# Patient Record
Sex: Female | Born: 1950 | Race: White | Hispanic: No | Smoking: Current every day smoker
Health system: Southern US, Community
[De-identification: ages and names within clinical notes are randomized; demographics above are authoritative.]

## PROBLEM LIST (undated history)

## (undated) DIAGNOSIS — E785 Hyperlipidemia, unspecified: Secondary | ICD-10-CM

## (undated) DIAGNOSIS — E559 Vitamin D deficiency, unspecified: Secondary | ICD-10-CM

## (undated) DIAGNOSIS — F32A Depression, unspecified: Secondary | ICD-10-CM

## (undated) DIAGNOSIS — F419 Anxiety disorder, unspecified: Secondary | ICD-10-CM

## (undated) DIAGNOSIS — J449 Chronic obstructive pulmonary disease, unspecified: Secondary | ICD-10-CM

## (undated) DIAGNOSIS — I10 Essential (primary) hypertension: Secondary | ICD-10-CM

## (undated) DIAGNOSIS — F329 Major depressive disorder, single episode, unspecified: Secondary | ICD-10-CM

## (undated) DIAGNOSIS — Z8709 Personal history of other diseases of the respiratory system: Secondary | ICD-10-CM

## (undated) DIAGNOSIS — J45909 Unspecified asthma, uncomplicated: Secondary | ICD-10-CM

## (undated) DIAGNOSIS — K759 Inflammatory liver disease, unspecified: Secondary | ICD-10-CM

## (undated) HISTORY — PX: OTHER SURGICAL HISTORY: SHX169

---

## 2013-04-12 ENCOUNTER — Encounter (HOSPITAL_COMMUNITY): Payer: Self-pay | Admitting: Anesthesiology

## 2013-04-12 ENCOUNTER — Inpatient Hospital Stay (HOSPITAL_COMMUNITY): Payer: BC Managed Care – PPO

## 2013-04-12 ENCOUNTER — Ambulatory Visit (HOSPITAL_COMMUNITY): Payer: BC Managed Care – PPO | Admitting: Anesthesiology

## 2013-04-12 ENCOUNTER — Ambulatory Visit (HOSPITAL_COMMUNITY): Payer: BC Managed Care – PPO

## 2013-04-12 ENCOUNTER — Encounter (HOSPITAL_COMMUNITY): Payer: BC Managed Care – PPO | Admitting: Anesthesiology

## 2013-04-12 ENCOUNTER — Inpatient Hospital Stay (HOSPITAL_COMMUNITY)
Admission: AD | Admit: 2013-04-12 | Discharge: 2013-04-16 | DRG: 493 | Disposition: A | Discharge status: Home Health | Payer: BC Managed Care – PPO | Source: Ambulatory Visit | Attending: Orthopedic Surgery | Admitting: Orthopedic Surgery

## 2013-04-12 ENCOUNTER — Encounter (HOSPITAL_COMMUNITY)
Admission: AD | Disposition: A | Discharge status: Home Health | Payer: Self-pay | Source: Ambulatory Visit | Attending: Orthopedic Surgery

## 2013-04-12 DIAGNOSIS — F3289 Other specified depressive episodes: Secondary | ICD-10-CM | POA: Diagnosis present

## 2013-04-12 DIAGNOSIS — F419 Anxiety disorder, unspecified: Secondary | ICD-10-CM | POA: Diagnosis present

## 2013-04-12 DIAGNOSIS — W19XXXA Unspecified fall, initial encounter: Secondary | ICD-10-CM | POA: Diagnosis present

## 2013-04-12 DIAGNOSIS — F32A Depression, unspecified: Secondary | ICD-10-CM | POA: Diagnosis present

## 2013-04-12 DIAGNOSIS — J449 Chronic obstructive pulmonary disease, unspecified: Secondary | ICD-10-CM | POA: Diagnosis present

## 2013-04-12 DIAGNOSIS — G8918 Other acute postprocedural pain: Secondary | ICD-10-CM | POA: Diagnosis not present

## 2013-04-12 DIAGNOSIS — D62 Acute posthemorrhagic anemia: Secondary | ICD-10-CM | POA: Diagnosis not present

## 2013-04-12 DIAGNOSIS — I10 Essential (primary) hypertension: Secondary | ICD-10-CM | POA: Diagnosis present

## 2013-04-12 DIAGNOSIS — Z7982 Long term (current) use of aspirin: Secondary | ICD-10-CM

## 2013-04-12 DIAGNOSIS — B182 Chronic viral hepatitis C: Secondary | ICD-10-CM | POA: Diagnosis present

## 2013-04-12 DIAGNOSIS — F411 Generalized anxiety disorder: Secondary | ICD-10-CM | POA: Diagnosis present

## 2013-04-12 DIAGNOSIS — M949 Disorder of cartilage, unspecified: Secondary | ICD-10-CM

## 2013-04-12 DIAGNOSIS — S42301A Unspecified fracture of shaft of humerus, right arm, initial encounter for closed fracture: Secondary | ICD-10-CM

## 2013-04-12 DIAGNOSIS — Z79899 Other long term (current) drug therapy: Secondary | ICD-10-CM

## 2013-04-12 DIAGNOSIS — E871 Hypo-osmolality and hyponatremia: Secondary | ICD-10-CM | POA: Diagnosis not present

## 2013-04-12 DIAGNOSIS — M899 Disorder of bone, unspecified: Secondary | ICD-10-CM | POA: Diagnosis present

## 2013-04-12 DIAGNOSIS — S42309A Unspecified fracture of shaft of humerus, unspecified arm, initial encounter for closed fracture: Principal | ICD-10-CM

## 2013-04-12 DIAGNOSIS — R110 Nausea: Secondary | ICD-10-CM | POA: Diagnosis not present

## 2013-04-12 DIAGNOSIS — E785 Hyperlipidemia, unspecified: Secondary | ICD-10-CM | POA: Diagnosis present

## 2013-04-12 DIAGNOSIS — J4489 Other specified chronic obstructive pulmonary disease: Secondary | ICD-10-CM | POA: Diagnosis present

## 2013-04-12 DIAGNOSIS — F17200 Nicotine dependence, unspecified, uncomplicated: Secondary | ICD-10-CM | POA: Diagnosis present

## 2013-04-12 DIAGNOSIS — E559 Vitamin D deficiency, unspecified: Secondary | ICD-10-CM | POA: Diagnosis present

## 2013-04-12 DIAGNOSIS — K759 Inflammatory liver disease, unspecified: Secondary | ICD-10-CM | POA: Diagnosis present

## 2013-04-12 DIAGNOSIS — Z66 Do not resuscitate: Secondary | ICD-10-CM | POA: Diagnosis present

## 2013-04-12 DIAGNOSIS — F329 Major depressive disorder, single episode, unspecified: Secondary | ICD-10-CM | POA: Diagnosis present

## 2013-04-12 HISTORY — DX: Essential (primary) hypertension: I10

## 2013-04-12 HISTORY — DX: Unspecified asthma, uncomplicated: J45.909

## 2013-04-12 HISTORY — DX: Hyperlipidemia, unspecified: E78.5

## 2013-04-12 HISTORY — DX: Personal history of other diseases of the respiratory system: Z87.09

## 2013-04-12 HISTORY — DX: Inflammatory liver disease, unspecified: K75.9

## 2013-04-12 HISTORY — DX: Anxiety disorder, unspecified: F41.9

## 2013-04-12 HISTORY — DX: Vitamin D deficiency, unspecified: E55.9

## 2013-04-12 HISTORY — DX: Chronic obstructive pulmonary disease, unspecified: J44.9

## 2013-04-12 HISTORY — DX: Major depressive disorder, single episode, unspecified: F32.9

## 2013-04-12 HISTORY — PX: ORIF HUMERUS FRACTURE: SHX2126

## 2013-04-12 HISTORY — DX: Depression, unspecified: F32.A

## 2013-04-12 LAB — COMPREHENSIVE METABOLIC PANEL
ALT: 7 U/L (ref 0–35)
AST: 18 U/L (ref 0–37)
Albumin: 2.5 g/dL — ABNORMAL LOW (ref 3.5–5.2)
Alkaline Phosphatase: 122 U/L — ABNORMAL HIGH (ref 39–117)
BILIRUBIN TOTAL: 0.3 mg/dL (ref 0.3–1.2)
BUN: 12 mg/dL (ref 6–23)
CALCIUM: 8.7 mg/dL (ref 8.4–10.5)
CO2: 24 meq/L (ref 19–32)
Chloride: 101 mEq/L (ref 96–112)
Creatinine, Ser: 1.12 mg/dL — ABNORMAL HIGH (ref 0.50–1.10)
GFR, EST AFRICAN AMERICAN: 60 mL/min — AB (ref >90)
GFR, EST NON AFRICAN AMERICAN: 52 mL/min — AB (ref >90)
Glucose, Bld: 103 mg/dL — ABNORMAL HIGH (ref 70–99)
Potassium: 5.6 mEq/L — ABNORMAL HIGH (ref 3.7–5.3)
Sodium: 138 mEq/L (ref 137–147)
Total Protein: 5.4 g/dL — ABNORMAL LOW (ref 6.0–8.3)

## 2013-04-12 LAB — CBC
HCT: 33.2 % — ABNORMAL LOW (ref 36.0–46.0)
HCT: 26.3 % — ABNORMAL LOW (ref 36.0–46.0)
HEMOGLOBIN: 8.9 g/dL — AB (ref 12.0–15.0)
Hemoglobin: 10.8 g/dL — ABNORMAL LOW (ref 12.0–15.0)
MCH: 32.2 pg (ref 26.0–34.0)
MCH: 32.1 pg (ref 26.0–34.0)
MCHC: 32.5 g/dL (ref 30.0–36.0)
MCHC: 33.8 g/dL (ref 30.0–36.0)
MCV: 99.1 fL (ref 78.0–100.0)
MCV: 94.9 fL (ref 78.0–100.0)
PLATELETS: 283 10*3/uL (ref 150–400)
Platelets: 283 10*3/uL (ref 150–400)
RBC: 3.35 MIL/uL — ABNORMAL LOW (ref 3.87–5.11)
RBC: 2.77 MIL/uL — ABNORMAL LOW (ref 3.87–5.11)
RDW: 13.3 % (ref 11.5–15.5)
RDW: 13.2 % (ref 11.5–15.5)
WBC: 7.9 10*3/uL (ref 4.0–10.5)
WBC: 10.7 10*3/uL — ABNORMAL HIGH (ref 4.0–10.5)

## 2013-04-12 LAB — APTT: aPTT: 30 seconds (ref 24–37)

## 2013-04-12 LAB — PROTIME-INR
INR: 0.92 (ref 0.00–1.49)
PROTHROMBIN TIME: 12.2 s (ref 11.6–15.2)

## 2013-04-12 SURGERY — OPEN REDUCTION INTERNAL FIXATION (ORIF) PROXIMAL HUMERUS FRACTURE
Anesthesia: General | Site: Arm Upper | Laterality: Right

## 2013-04-12 MED ORDER — POTASSIUM CHLORIDE IN NACL 20-0.9 MEQ/L-% IV SOLN
INTRAVENOUS | Status: DC
Start: 2013-04-12 — End: 2013-04-15
  Administered 2013-04-12 – 2013-04-15 (×3): via INTRAVENOUS
  Filled 2013-04-12 (×9): qty 1000

## 2013-04-12 MED ORDER — MAGNESIUM CITRATE PO SOLN: 1.0000 | Freq: Once | ORAL | Status: AC | PRN

## 2013-04-12 MED ORDER — FLUOXETINE HCL 20 MG PO CAPS
20.0000 mg | ORAL_CAPSULE | Freq: Every day | ORAL | Status: DC
  Administered 2013-04-13 – 2013-04-15 (×3): 20 mg via ORAL
  Filled 2013-04-12 (×5): qty 1

## 2013-04-12 MED ORDER — ONDANSETRON HCL 4 MG/2ML IJ SOLN
4.0000 mg | Freq: Once | INTRAMUSCULAR | Status: AC | PRN
  Administered 2013-04-12: 4 mg via INTRAVENOUS

## 2013-04-12 MED ORDER — LACTATED RINGERS IV SOLN
INTRAVENOUS | Status: DC | PRN
  Administered 2013-04-12 (×2): via INTRAVENOUS

## 2013-04-12 MED ORDER — METHOCARBAMOL 100 MG/ML IJ SOLN
500.0000 mg | Freq: Four times a day (QID) | INTRAVENOUS | Status: DC | PRN
  Filled 2013-04-12: qty 5

## 2013-04-12 MED ORDER — FENTANYL CITRATE 0.05 MG/ML IJ SOLN
INTRAMUSCULAR | Status: AC
  Administered 2013-04-12: 100 ug
  Filled 2013-04-12: qty 2

## 2013-04-12 MED ORDER — DIPHENHYDRAMINE HCL 12.5 MG/5ML PO ELIX
12.5000 mg | ORAL_SOLUTION | ORAL | Status: DC | PRN
  Administered 2013-04-15: 25 mg via ORAL
  Filled 2013-04-12 (×2): qty 10

## 2013-04-12 MED ORDER — PROPOFOL 10 MG/ML IV BOLUS
INTRAVENOUS | Status: DC | PRN
  Administered 2013-04-12: 200 mg via INTRAVENOUS

## 2013-04-12 MED ORDER — HYDROMORPHONE HCL PF 1 MG/ML IJ SOLN
0.5000 mg | INTRAMUSCULAR | Status: DC | PRN
  Administered 2013-04-13 – 2013-04-14 (×6): 1 mg via INTRAVENOUS
  Administered 2013-04-14: 0.5 mg via INTRAVENOUS
  Administered 2013-04-14 – 2013-04-16 (×4): 1 mg via INTRAVENOUS
  Filled 2013-04-12 (×12): qty 1

## 2013-04-12 MED ORDER — ONDANSETRON HCL 4 MG/2ML IJ SOLN
INTRAMUSCULAR | Status: AC
  Administered 2013-04-12: 18:00:00 4 mg via INTRAVENOUS
  Filled 2013-04-12: qty 2

## 2013-04-12 MED ORDER — ONDANSETRON HCL 4 MG/2ML IJ SOLN
4.0000 mg | Freq: Four times a day (QID) | INTRAMUSCULAR | Status: DC | PRN
  Administered 2013-04-13 (×2): 4 mg via INTRAVENOUS
  Filled 2013-04-12 (×2): qty 2

## 2013-04-12 MED ORDER — METOCLOPRAMIDE HCL 5 MG/ML IJ SOLN
5.0000 mg | Freq: Three times a day (TID) | INTRAMUSCULAR | Status: DC | PRN
  Filled 2013-04-12 (×2): qty 2

## 2013-04-12 MED ORDER — HYDROMORPHONE HCL PF 1 MG/ML IJ SOLN
0.2500 mg | INTRAMUSCULAR | Status: DC | PRN
  Administered 2013-04-12 (×4): 0.5 mg via INTRAVENOUS

## 2013-04-12 MED ORDER — GLYCOPYRROLATE 0.2 MG/ML IJ SOLN
INTRAMUSCULAR | Status: DC | PRN
  Administered 2013-04-12: 0.6 mg via INTRAVENOUS

## 2013-04-12 MED ORDER — OXYCODONE HCL 5 MG PO TABS
ORAL_TABLET | ORAL | Status: AC
Start: 2013-04-12 — End: 2013-04-13
  Filled 2013-04-12: qty 3

## 2013-04-12 MED ORDER — FENTANYL CITRATE 0.05 MG/ML IJ SOLN
INTRAMUSCULAR | Status: DC | PRN
  Administered 2013-04-12: 50 ug via INTRAVENOUS
  Administered 2013-04-12 (×2): 100 ug via INTRAVENOUS

## 2013-04-12 MED ORDER — DOCUSATE SODIUM 100 MG PO CAPS
100.0000 mg | ORAL_CAPSULE | Freq: Two times a day (BID) | ORAL | Status: DC
  Administered 2013-04-13 – 2013-04-16 (×7): 100 mg via ORAL
  Filled 2013-04-12 (×9): qty 1

## 2013-04-12 MED ORDER — METHOCARBAMOL 100 MG/ML IJ SOLN
500.0000 mg | INTRAVENOUS | Status: AC
  Administered 2013-04-12: 500 mg via INTRAVENOUS
  Filled 2013-04-12: qty 5

## 2013-04-12 MED ORDER — CEFAZOLIN SODIUM 1-5 GM-% IV SOLN
1.0000 g | Freq: Four times a day (QID) | INTRAVENOUS | Status: AC
  Administered 2013-04-12 – 2013-04-13 (×3): 1 g via INTRAVENOUS
  Filled 2013-04-12 (×3): qty 50

## 2013-04-12 MED ORDER — CEFAZOLIN SODIUM-DEXTROSE 2-3 GM-% IV SOLR
INTRAVENOUS | Status: AC
  Administered 2013-04-12: 2 g via INTRAVENOUS
  Filled 2013-04-12: qty 50

## 2013-04-12 MED ORDER — NEOSTIGMINE METHYLSULFATE 1 MG/ML IJ SOLN
INTRAMUSCULAR | Status: DC | PRN
  Administered 2013-04-12: 4 mg via INTRAVENOUS
  Administered 2013-04-12: 1 mg via INTRAVENOUS

## 2013-04-12 MED ORDER — ONDANSETRON HCL 4 MG/2ML IJ SOLN
INTRAMUSCULAR | Status: DC | PRN
  Administered 2013-04-12: 4 mg via INTRAVENOUS

## 2013-04-12 MED ORDER — OXYCODONE HCL 5 MG PO TABS
5.0000 mg | ORAL_TABLET | ORAL | Status: DC | PRN
  Administered 2013-04-12 – 2013-04-15 (×11): 15 mg via ORAL
  Administered 2013-04-15: 10 mg via ORAL
  Administered 2013-04-15 – 2013-04-16 (×4): 15 mg via ORAL
  Filled 2013-04-12 (×10): qty 3
  Filled 2013-04-12: qty 2
  Filled 2013-04-12 (×4): qty 3

## 2013-04-12 MED ORDER — HYDROMORPHONE HCL PF 1 MG/ML IJ SOLN
INTRAMUSCULAR | Status: AC
  Administered 2013-04-12: 0.5 mg via INTRAVENOUS
  Filled 2013-04-12: qty 1

## 2013-04-12 MED ORDER — METHOCARBAMOL 500 MG PO TABS
500.0000 mg | ORAL_TABLET | Freq: Four times a day (QID) | ORAL | Status: DC | PRN
  Administered 2013-04-14: 1000 mg via ORAL
  Filled 2013-04-12 (×2): qty 2

## 2013-04-12 MED ORDER — ROCURONIUM BROMIDE 100 MG/10ML IV SOLN
INTRAVENOUS | Status: DC | PRN
  Administered 2013-04-12: 40 mg via INTRAVENOUS
  Administered 2013-04-12 (×3): 10 mg via INTRAVENOUS

## 2013-04-12 MED ORDER — ALBUTEROL SULFATE HFA 108 (90 BASE) MCG/ACT IN AERS
INHALATION_SPRAY | RESPIRATORY_TRACT | Status: DC | PRN
  Administered 2013-04-12 (×2): 2 via RESPIRATORY_TRACT

## 2013-04-12 MED ORDER — 0.9 % SODIUM CHLORIDE (POUR BTL) OPTIME
TOPICAL | Status: DC | PRN
  Administered 2013-04-12: 1000 mL

## 2013-04-12 MED ORDER — ALBUMIN HUMAN 5 % IV SOLN
INTRAVENOUS | Status: DC | PRN
  Administered 2013-04-12: 16:00:00 via INTRAVENOUS

## 2013-04-12 MED ORDER — METOCLOPRAMIDE HCL 5 MG PO TABS
5.0000 mg | ORAL_TABLET | Freq: Three times a day (TID) | ORAL | Status: DC | PRN
  Filled 2013-04-12: qty 2

## 2013-04-12 MED ORDER — PHENYLEPHRINE HCL 10 MG/ML IJ SOLN
10.0000 mg | INTRAVENOUS | Status: DC | PRN
  Administered 2013-04-12: 25 ug/min via INTRAVENOUS

## 2013-04-12 MED ORDER — BISACODYL 10 MG RE SUPP
10.0000 mg | Freq: Every day | RECTAL | Status: DC | PRN
Start: 2013-04-12 — End: 2013-04-16

## 2013-04-12 MED ORDER — FERROUS SULFATE 325 (65 FE) MG PO TABS
325.0000 mg | ORAL_TABLET | Freq: Three times a day (TID) | ORAL | Status: DC
  Administered 2013-04-13 – 2013-04-16 (×9): 325 mg via ORAL
  Filled 2013-04-12 (×13): qty 1

## 2013-04-12 MED ORDER — LACTATED RINGERS IV SOLN
INTRAVENOUS | Status: DC
  Administered 2013-04-12: 13:00:00 via INTRAVENOUS

## 2013-04-12 MED ORDER — LIDOCAINE HCL (CARDIAC) 20 MG/ML IV SOLN
INTRAVENOUS | Status: DC | PRN
  Administered 2013-04-12: 100 mg via INTRAVENOUS

## 2013-04-12 MED ORDER — ONDANSETRON HCL 4 MG PO TABS: 4.0000 mg | ORAL_TABLET | Freq: Four times a day (QID) | ORAL | Status: DC | PRN

## 2013-04-12 SURGICAL SUPPLY — 80 items
BANDAGE ELASTIC 3 VELCRO ST LF (GAUZE/BANDAGES/DRESSINGS) ×3 IMPLANT
BANDAGE ELASTIC 4 VELCRO ST LF (GAUZE/BANDAGES/DRESSINGS) ×3 IMPLANT
BANDAGE GAUZE ELAST BULKY 4 IN (GAUZE/BANDAGES/DRESSINGS) ×6 IMPLANT
BENZOIN TINCTURE PRP APPL 2/3 (GAUZE/BANDAGES/DRESSINGS) ×6 IMPLANT
BIT DRILL 2.5 X LONG (BIT) ×1
BIT DRILL PERC QC 2.8X200 100 (BIT) ×2 IMPLANT
BIT DRILL QC 3.5X110 (BIT) ×3 IMPLANT
BIT DRILL X LONG 2.5 (BIT) ×1 IMPLANT
BRUSH SCRUB DISP (MISCELLANEOUS) ×6 IMPLANT
CLOTH BEACON ORANGE TIMEOUT ST (SAFETY) ×3 IMPLANT
COVER SURGICAL LIGHT HANDLE (MISCELLANEOUS) ×6 IMPLANT
DRAPE C-ARM 42X72 X-RAY (DRAPES) ×3 IMPLANT
DRAPE C-ARMOR (DRAPES) ×3 IMPLANT
DRAPE INCISE IOBAN 66X45 STRL (DRAPES) IMPLANT
DRAPE ORTHO SPLIT 77X108 STRL (DRAPES) ×4
DRAPE SURG 17X11 SM STRL (DRAPES) ×6 IMPLANT
DRAPE SURG ORHT 6 SPLT 77X108 (DRAPES) ×2 IMPLANT
DRAPE U-SHAPE 47X51 STRL (DRAPES) ×6 IMPLANT
DRILL BIT QUICK COUP 2.8MM 100 (BIT) ×4
DRILL BIT X LONG 2.5 (BIT) ×2
DRSG ADAPTIC 3X8 NADH LF (GAUZE/BANDAGES/DRESSINGS) ×3 IMPLANT
DRSG MEPILEX BORDER 4X12 (GAUZE/BANDAGES/DRESSINGS) ×3 IMPLANT
DRSG PAD ABDOMINAL 8X10 ST (GAUZE/BANDAGES/DRESSINGS) ×3 IMPLANT
ELECT REM PT RETURN 9FT ADLT (ELECTROSURGICAL) ×3
ELECTRODE REM PT RTRN 9FT ADLT (ELECTROSURGICAL) ×1 IMPLANT
EVACUATOR 1/8 PVC DRAIN (DRAIN) IMPLANT
GLOVE BIO SURGEON STRL SZ7.5 (GLOVE) ×3 IMPLANT
GLOVE BIO SURGEON STRL SZ8 (GLOVE) ×3 IMPLANT
GLOVE BIOGEL PI IND STRL 7.5 (GLOVE) ×1 IMPLANT
GLOVE BIOGEL PI IND STRL 8 (GLOVE) ×1 IMPLANT
GLOVE BIOGEL PI INDICATOR 7.5 (GLOVE) ×2
GLOVE BIOGEL PI INDICATOR 8 (GLOVE) ×2
GOWN PREVENTION PLUS XLARGE (GOWN DISPOSABLE) ×3 IMPLANT
GOWN PREVENTION PLUS XXLARGE (GOWN DISPOSABLE) ×3 IMPLANT
GOWN STRL NON-REIN LRG LVL3 (GOWN DISPOSABLE) ×6 IMPLANT
KIT BASIN OR (CUSTOM PROCEDURE TRAY) ×3 IMPLANT
KIT INFUSE LRG II (Orthopedic Implant) ×3 IMPLANT
KIT ROOM TURNOVER OR (KITS) ×3 IMPLANT
LOOP VESSEL MAXI BLUE (MISCELLANEOUS) IMPLANT
MANIFOLD NEPTUNE II (INSTRUMENTS) ×3 IMPLANT
NS IRRIG 1000ML POUR BTL (IV SOLUTION) ×3 IMPLANT
PACK TOTAL JOINT (CUSTOM PROCEDURE TRAY) ×3 IMPLANT
PAD ARMBOARD 7.5X6 YLW CONV (MISCELLANEOUS) ×6 IMPLANT
PLATE LCP 16 HOLE 3.5 (Plate) ×6 IMPLANT
SCREW CANC FT 4.0X40 (Screw) ×3 IMPLANT
SCREW CORTEX 3.5 22MM (Screw) ×6 IMPLANT
SCREW CORTEX 3.5 24MM (Screw) ×4 IMPLANT
SCREW CORTEX 3.5 28MM (Screw) ×2 IMPLANT
SCREW LOCK CORT ST 3.5X22 (Screw) ×3 IMPLANT
SCREW LOCK CORT ST 3.5X24 (Screw) ×2 IMPLANT
SCREW LOCK CORT ST 3.5X28 (Screw) ×1 IMPLANT
SCREW LOCK T15 FT 22X3.5XST (Screw) ×1 IMPLANT
SCREW LOCK T15 FT 30X3.5X2.9X (Screw) ×1 IMPLANT
SCREW LOCK T15 FT 38X3.5XST (Screw) ×1 IMPLANT
SCREW LOCK T15 FT 40X3.5XST (Screw) ×1 IMPLANT
SCREW LOCKING 3.5X22 (Screw) ×2 IMPLANT
SCREW LOCKING 3.5X30 (Screw) ×2 IMPLANT
SCREW LOCKING 3.5X38 (Screw) ×2 IMPLANT
SCREW LOCKING 3.5X40 (Screw) ×2 IMPLANT
SLING ARM FOAM STRAP MED (SOFTGOODS) ×3 IMPLANT
SPONGE GAUZE 4X4 12PLY (GAUZE/BANDAGES/DRESSINGS) ×6 IMPLANT
SPONGE LAP 18X18 X RAY DECT (DISPOSABLE) IMPLANT
STAPLER VISISTAT 35W (STAPLE) ×3 IMPLANT
STOCKINETTE IMPERVIOUS LG (DRAPES) ×3 IMPLANT
SUCTION FRAZIER TIP 10 FR DISP (SUCTIONS) ×3 IMPLANT
SUT ETHIBOND 5 LR DA (SUTURE) ×3 IMPLANT
SUT FIBERWIRE #2 38 T-5 BLUE (SUTURE)
SUT PDS AB 2-0 CT1 27 (SUTURE) IMPLANT
SUT VIC AB 0 CT1 27 (SUTURE) ×4
SUT VIC AB 0 CT1 27XBRD ANBCTR (SUTURE) ×2 IMPLANT
SUT VIC AB 2-0 CT1 27 (SUTURE) ×4
SUT VIC AB 2-0 CT1 TAPERPNT 27 (SUTURE) ×2 IMPLANT
SUT VIC AB 2-0 CT3 27 (SUTURE) IMPLANT
SUTURE FIBERWR #2 38 T-5 BLUE (SUTURE) IMPLANT
SYR 5ML LL (SYRINGE) IMPLANT
TOWEL OR 17X24 6PK STRL BLUE (TOWEL DISPOSABLE) ×3 IMPLANT
TOWEL OR 17X26 10 PK STRL BLUE (TOWEL DISPOSABLE) ×6 IMPLANT
TRAY FOLEY CATH 16FRSI W/METER (SET/KITS/TRAYS/PACK) IMPLANT
WATER STERILE IRR 1000ML POUR (IV SOLUTION) ×3 IMPLANT
YANKAUER SUCT BULB TIP NO VENT (SUCTIONS) IMPLANT

## 2013-04-12 NOTE — Transfer of Care (Signed)
Immediate Anesthesia Transfer of Care Note  Patient: [name] [name]  Procedure(s) Performed: Procedure(s): OPEN REDUCTION INTERNAL FIXATION (ORIF) PROXIMAL HUMERUS FRACTURE (Right)  Patient Location: PACU  Anesthesia Type:GA combined with regional for post-op pain  Level of Consciousness: awake, alert  and oriented  Airway & Oxygen Therapy: Patient Spontanous Breathing and Patient connected to nasal cannula oxygen  Post-op Assessment: Report given to PACU RN and Post -op Vital signs reviewed and stable  Post vital signs: Reviewed and stable  Complications: No apparent anesthesia complications

## 2013-04-12 NOTE — Anesthesia Procedure Notes (Addendum)
Anesthesia Regional Block:  Interscalene brachial plexus block  Pre-Anesthetic Checklist: ,, timeout performed, Correct Patient, Correct Site, Correct Laterality, Correct Procedure, Correct Position, site marked, Risks and benefits discussed,  Surgical consent,  Pre-op evaluation,  At surgeon's request and post-op pain management  Laterality: Right  Prep: chloraprep and alcohol swabs       Needles:  Injection technique: Single-shot  Needle Type: Stimulator Needle - 40        Needle insertion depth: 2 cm   Additional Needles:  Procedures: nerve stimulator Interscalene brachial plexus block  Nerve Stimulator or Paresthesia:  Response: 0.5 mA, 0.1 ms, 2 cm  Additional Responses:   Narrative:  Start time: 04/12/2013 1:50 PM End time: 04/12/2013 1:55 PM Injection made incrementally with aspirations every 5 mL.  Performed by: Personally  Anesthesiologist: Gregory E Smith MD  Additional Notes: Pt accepts procedure and risks.16cc 0.5% Marcaine w/ epi w/o difficulty or discomfort. GES   Procedure Name: Intubation Date/Time: 04/12/2013 2:11 PM Performed by: ,  A Pre-anesthesia Checklist: Patient identified, Timeout performed, Emergency Drugs available, Suction available and Patient being monitored Patient Re-evaluated:Patient Re-evaluated prior to inductionOxygen Delivery Method: Circle system utilized Preoxygenation: Pre-oxygenation with 100% oxygen Intubation Type: IV induction Ventilation: Mask ventilation without difficulty Laryngoscope Size: Mac and 3 Grade View: Grade I Tube type: Oral Tube size: 7.0 mm Number of attempts: 1 Airway Equipment and Method: Stylet and LTA kit utilized Placement Confirmation: ETT inserted through vocal cords under direct vision,  positive ETCO2 and breath sounds checked- equal and bilateral Secured at: 21 cm Tube secured with: Tape Dental Injury: Teeth and Oropharynx as per pre-operative assessment      

## 2013-04-12 NOTE — Anesthesia Preprocedure Evaluation (Addendum)
Anesthesia Evaluation  Patient identified by MRN, date of birth, ID band Patient awake    Reviewed: Allergy & Precautions, H&P , NPO status , Patient's Chart, lab work & pertinent test results  Airway       Dental   Pulmonary asthma , COPDCurrent Smoker,          Cardiovascular hypertension,     Neuro/Psych    GI/Hepatic (+) Hepatitis -, C  Endo/Other    Renal/GU      Musculoskeletal   Abdominal   Peds  Hematology   Anesthesia Other Findings   Reproductive/Obstetrics                          Anesthesia Physical Anesthesia Plan  ASA: II  Anesthesia Plan: General   Post-op Pain Management:    Induction: Intravenous  Airway Management Planned: Oral ETT  Additional Equipment:   Intra-op Plan:   Post-operative Plan: Extubation in OR  Informed Consent: I have reviewed the patients History and Physical, chart, labs and discussed the procedure including the risks, benefits and alternatives for the proposed anesthesia with the patient or authorized representative who has indicated his/her understanding and acceptance.     Plan Discussed with:   Anesthesia Plan Comments:         Anesthesia Quick Evaluation

## 2013-04-12 NOTE — Progress Notes (Signed)
[facility];Casey took pocketbook

## 2013-04-12 NOTE — Progress Notes (Signed)
Notified on-call MD regarding question of Pt's Code status. Patient states she is a DNR, yet there was an order for the patient to be a full code. MD states he will consult attending about issue. Patient with vital signs stable NAD. Will continue to monitor closely.

## 2013-04-12 NOTE — Brief Op Note (Signed)
04/12/2013  4:50 PM  PATIENT:  [name] NorriePatricia A [name]  63 y.o. female  PRE-OPERATIVE DIAGNOSIS:  right humerus fracture nonunion  POST-OPERATIVE DIAGNOSIS:  right humerus fracture nonunion  PROCEDURE:  Procedure(s): 1.OPEN REDUCTION INTERNAL FIXATION (ORIF) PROXIMAL HUMERUS FRACTURE (Right) 2. Infuse allografting  SURGEON:  Surgeon(s) and Role:    * Budd PalmerMichael H Kymoni Lesperance, MD - Primary  PHYSICIAN ASSISTANT: Montez MoritaKeith Paul, PA-C  ANESTHESIA:   general  I/O:  Total I/O In: 1000 [I.V.:1000] Out: 375 [Urine:125; Blood:250]  SPECIMEN:  No Specimen  TOURNIQUET:  * No tourniquets in log *  DICTATION: .Other Dictation: Dictation Number [id number]

## 2013-04-12 NOTE — Progress Notes (Signed)
Pt is a DNR.

## 2013-04-12 NOTE — H&P (Addendum)
                                    [facility] H&P  CC: R proximal 1/3 humeral shaft fx  Full consult dictated: [id number]   HPI:  63 y/o RHD White female, h/o COPD, Hep C sustained a ground level fall on 03/14/2013 with resultant R humerus fracture. She was down for an unspecified amount of time. Was able to finally get help. She was brought to [facility] for evaluation. In addition to her R humerus fracture she as found to be in ARF with Rhabdo and dehydration. Pt was admitted to the medical service for tx. She was also found to be in acute respiratory failure, she was not intubated during that admission. Pt was hospitalized about 7 days and was then discharged to home. She followed up with Ervin Knack and was ultimately sent to OTS for definitive tx of her R humerus.  I was able to obtain labs from PCP (Dr. Micheal Likens) taken on 01/20/2013   Bun: 20  Cr: 1.18  eGFR: 50  AST: 56  ALT: 47  Plt: 256  Hgb: 14.2  Hct: 41.0   A/P  63 y/o female with R proximal 1/3 humeral shaft fx with resolving ARF, + medical comorbidities  1. R proximal humerus fx  Will need ORIF  Need to ensure is medically optimized before proceeding to [place] to delay if any additional studies felt to be necessary  Will admit to hospital today if delay needed for w/u  Will likely get medicine consult to assist with medical management  2. Medical issues  Monitor  Home meds as needed  Medicine consult  Concerned that pt is somewhat sicker that she would have [place] believe  3. Dispo  Possible OR today    [name], PA-C  [facility]  [phone] (P)  04/12/2013  12:35 PM

## 2013-04-12 NOTE — H&P (Signed)
I have seen and examined the patient. I agree with the findings above.  I have discussed the risks and benefits of surgery for her right, dominant arm humerus fracture, including the possibility of infection, nerve injury, vessel injury, wound breakdown, arthritis, symptomatic hardware, DVT/ PE, loss of motion, and need for further surgery among others.  She understood these risks and wished to proceed.  Budd PalmerHANDY,Wardell Pokorski H, MD 04/12/2013 1:45 PM

## 2013-04-12 NOTE — Anesthesia Postprocedure Evaluation (Signed)
  Anesthesia Post-op Note  Patient: [name] [name]  Procedure(s) Performed: Procedure(s): OPEN REDUCTION INTERNAL FIXATION (ORIF) PROXIMAL HUMERUS FRACTURE (Right)  Patient Location: PACU  Anesthesia Type:General and GA combined with regional for post-op pain  Level of Consciousness: awake, alert  and oriented  Airway and Oxygen Therapy: Patient Spontanous Breathing and Patient connected to face mask oxygen  Post-op Pain: moderate  Post-op Assessment: Post-op Vital signs reviewed  Post-op Vital Signs: Reviewed  Complications: No apparent anesthesia complications

## 2013-04-13 LAB — BASIC METABOLIC PANEL
BUN: 12 mg/dL (ref 6–23)
CALCIUM: 8 mg/dL — AB (ref 8.4–10.5)
CO2: 27 mEq/L (ref 19–32)
CREATININE: 1.21 mg/dL — AB (ref 0.50–1.10)
Chloride: 97 mEq/L (ref 96–112)
GFR calc non Af Amer: 47 mL/min — ABNORMAL LOW (ref >90)
GFR, EST AFRICAN AMERICAN: 54 mL/min — AB (ref >90)
Glucose, Bld: 116 mg/dL — ABNORMAL HIGH (ref 70–99)
Potassium: 5 mEq/L (ref 3.7–5.3)
Sodium: 135 mEq/L — ABNORMAL LOW (ref 137–147)

## 2013-04-13 LAB — MRSA PCR SCREENING: MRSA BY PCR: NEGATIVE

## 2013-04-13 LAB — CBC
HEMATOCRIT: 24.1 % — AB (ref 36.0–46.0)
Hemoglobin: 7.8 g/dL — ABNORMAL LOW (ref 12.0–15.0)
MCH: 32.1 pg (ref 26.0–34.0)
MCHC: 32.4 g/dL (ref 30.0–36.0)
MCV: 99.2 fL (ref 78.0–100.0)
PLATELETS: 252 10*3/uL (ref 150–400)
RBC: 2.43 MIL/uL — ABNORMAL LOW (ref 3.87–5.11)
RDW: 13.6 % (ref 11.5–15.5)
WBC: 7.7 10*3/uL (ref 4.0–10.5)

## 2013-04-13 LAB — ABO/RH: ABO/RH(D): A POS

## 2013-04-13 LAB — VITAMIN D 25 HYDROXY (VIT D DEFICIENCY, FRACTURES): VIT D 25 HYDROXY: 40 ng/mL (ref 30–89)

## 2013-04-13 LAB — PREPARE RBC (CROSSMATCH)

## 2013-04-13 MED ORDER — TRAMADOL HCL 50 MG PO TABS
50.0000 mg | ORAL_TABLET | Freq: Four times a day (QID) | ORAL | Status: DC | PRN
  Filled 2013-04-13: qty 1

## 2013-04-13 MED ORDER — PROMETHAZINE HCL 25 MG/ML IJ SOLN
12.5000 mg | Freq: Four times a day (QID) | INTRAMUSCULAR | Status: DC | PRN
  Administered 2013-04-13 – 2013-04-16 (×5): 12.5 mg via INTRAVENOUS
  Filled 2013-04-13 (×6): qty 1

## 2013-04-13 MED ORDER — FUROSEMIDE 10 MG/ML IJ SOLN
20.0000 mg | Freq: Once | INTRAMUSCULAR | Status: AC
  Administered 2013-04-13: 20 mg via INTRAVENOUS

## 2013-04-13 MED ORDER — ALPRAZOLAM 0.5 MG PO TABS
0.5000 mg | ORAL_TABLET | Freq: Two times a day (BID) | ORAL | Status: DC | PRN
  Administered 2013-04-13 – 2013-04-16 (×5): 0.5 mg via ORAL
  Filled 2013-04-13 (×5): qty 1

## 2013-04-13 MED ORDER — DIPHENHYDRAMINE HCL 25 MG PO CAPS
25.0000 mg | ORAL_CAPSULE | Freq: Once | ORAL | Status: AC
  Administered 2013-04-13: 25 mg via ORAL
  Filled 2013-04-13: qty 1

## 2013-04-13 MED ORDER — FUROSEMIDE 10 MG/ML IJ SOLN
INTRAMUSCULAR | Status: AC
  Filled 2013-04-13: qty 4

## 2013-04-13 NOTE — Progress Notes (Signed)
OT Cancellation Note  Patient Details Name: [name] [name] MRN: [name] DOB: 01-26-1951   Cancelled Treatment:     Attempted to see. Pt with c/o nausea. Will see in am  [facility] Center[facility],HILLARY [name], OTR/L  [phone] 04/13/2013  04/13/2013, 4:09 PM

## 2013-04-13 NOTE — Progress Notes (Signed)
Patient tolerated unit of pRBC's.

## 2013-04-13 NOTE — Op Note (Signed)
[name]:  [name], [name]             ACCOUNT NO.:  [id number]  MEDICAL RECORD NO.:  [id number]  LOCATION:  3S05C                        FACILITY:  [facility]  PHYSICIAN:  Doralee AlbinoMichael H. Carola FrostHandy, M.D. DATE OF BIRTH:  [birth year]  DATE OF PROCEDURE:  04/12/2013 DATE OF DISCHARGE:                              OPERATIVE REPORT   PREOPERATIVE DIAGNOSIS:  Right humerus fracture, nonunion.  POSTOPERATIVE DIAGNOSIS:  Right humerus fracture, nonunion.  PROCEDURE: 1. Open reduction and internal fixation of right proximal humerus     fracture. 2. Infuse allografting.  SURGEON:  Doralee AlbinoMichael H. Carola FrostHandy, M.D.  ASSISTANT:  Mearl LatinKeith W Paul, GeorgiaPA.  ANESTHESIA:  General.  COMPLICATIONS:  None.  I/O:  1000 mL crystalloid out.  UOP:  125.  EBL:  250.  SPECIMENS:  None.  DISPOSITION:  To PACU.  CONDITION:  Stable.  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  I did discuss with her the elevated risk of bleeding, nonunion, failure of the repair, the need for further surgery, as well as many others.  She did understand these risk and wished to proceed with repair. Specifically, her fracture pattern was a proximal third with the entire deltoid insertion on the proximal fragment resulting in rather severe varus.  This pattern associated with a high rate of nonunion and this was supported by the very large gap in her fracture site on follow [place] where she was believed to have a problem outside the scope of the orthopedic surgeon there with plans to transfer to an orthopedic the surgeon with subspecialty training and trauma.  BRIEF SUMMARY OF PROCEDURE:  [name] was taken to the operating room where after administration of preop antibiotics,  her right upper extremity was prepped and draped in usual sterile fashion.  No tourniquet was used during the procedure.  A standard anterolateral approach to the humeral shaft and shoulder was then made.  It was quite difficult because of the attempted callus and then the pseudo capsule. I did encounter a large collection of fluid consistent with evolving nonunion.  The bone ends were identified and cleaned while keeping as much blood supply as absolutely possible intact.  Reduction maneuver was performed.  There was rather significant comminution and I biomechanically chose to select a bridge plate for best biologic environment and thought to establish some slight valgus as well. Reduction maneuver was performed, a lag screw placed in the 2 primary fragments, and then a plate contoured with a slight valgus bend along the side.  This was followed by placement of the plate, one screw proximally and distally to check the alignment and then multiple standard screws, and then 1 lock screw distally and 3 proximally to the proximal humerus.  Bone quality of the limb itself to improve fixation with the locked screws.  Bone quality was surprisingly quite good.  Post reduction films showed slight increase in valgus angulation from the initial provisional fixation, but quite acceptable with outstanding fixation and good bridging biologically of  the fracture site.  Large infuse sponge was placed into the nonunion site, and let to extend distally over the area where it was anatomically reduced along the lateral cortex as well.  Montez Morita, PA-C assisted me throughout and was necessary for the procedure as again it was very difficult to obtain exposure and also he maintained reduction during provisional and definitive fixation and assisted with wound closure as well.  PROGNOSIS:  [name] [name] remains at elevated risk for complications given the multiple medical problems and heavy smoking history, but  the improvement in alignment and infuse allografting should help to mitigate against this while she goes on to unite.     Doralee Albino. Carola Frost, M.D.     M[facility]/MEDQ  D:  04/12/2013  T:  04/13/2013  Job:  [id number]

## 2013-04-13 NOTE — Evaluation (Signed)
Physical Therapy Evaluation Patient Details Name: [name] [name] MRN: [id number] DOB: 11/22/1950 Today's Date: 04/13/2013 Time: [id number] PT Time Calculation (min): 38 min  PT Assessment / Plan / Recommendation History of Present Illness  Pt s/p mechanical fall who suffered R humerus fx. Pt underwent ORIF to R humeral fx 1/5.  Clinical Impression  Pt indep PTA with h/o falls. Pt mobility currently limited by R UE pain and nausea. Pt reports to having 24/7 assist avail upon d/c. Acute PT to con't to see to progress mobility for safe transition home.    PT Assessment  Patient needs continued PT services    Follow Up Recommendations  Home health PT;Supervision/Assistance - 24 hour    Does the patient have the potential to tolerate intense rehabilitation      Barriers to Discharge        Equipment Recommendations   (TBD)    Recommendations for Other Services     Frequency Min 4X/week    Precautions / Restrictions Precautions Precautions: Fall Precaution Comments: increased HR into 140s-150s with mvmt, most likely due to pain. decreased to 110s-120s at rest. Restrictions Weight Bearing Restrictions: Yes RUE Weight Bearing: Non weight bearing   Pertinent Vitals/Pain 10/10 R shld pain, HR into 140s-150s during mvmt, 110 at rest      Mobility  Bed Mobility Bed Mobility: Supine to Sit;Sitting - Scoot to Edge of Bed Supine to Sit: 4: Min guard;HOB elevated (hob all the way up) Sitting - Scoot to Edge of Bed: 4: Min guard Details for Bed Mobility Assistance: increased time, onset of nausea Transfers Transfers: Sit to Stand;Stand to Dollar GeneralSit;Stand Pivot Transfers Sit to Stand: With upper extremity assist;From bed;4: Min assist Stand to Sit: 4: Min guard;With upper extremity assist;To chair/3-in-1 Stand Pivot Transfers: 4: Min assist Details for Transfer Assistance: pt very anxious re: falling, pt nervous/shaky, minA to steady pt Ambulation/Gait Ambulation/Gait Assistance: Not  tested (comment) (pt deferred until next session due to pain and nausea)    Exercises     PT Diagnosis: Difficulty walking;Acute pain  PT Problem List: Decreased strength;Decreased activity tolerance;Decreased balance;Decreased mobility PT Treatment Interventions: DME instruction;Gait training;Functional mobility training;Therapeutic activities;Therapeutic exercise;Balance training     PT Goals(Current goals can be found in the care plan section) Acute Rehab PT Goals Patient Stated Goal: home PT Goal Formulation: With patient Time For Goal Achievement: 04/20/13 Potential to Achieve Goals: Good  Visit Information  Last PT Received On: 04/13/13 Assistance Needed: +1 History of Present Illness: Pt s/p mechanical fall who suffered R humerus fx. Pt underwent ORIF to R humeral fx 1/5.       Prior Functioning  Home Living Family/patient expects to be discharged to:: Private residence Living Arrangements: Alone Available Help at Discharge: Family;Available 24 hours/day Type of Home: House Home Access: Ramped entrance Home Layout: Two level Alternate Level Stairs-Number of Steps: 12 (has lift chair to negotiate stairs) Home Equipment: Bedside commode;Shower seat;Wheelchair - manual (lift chair, lift chair for stair as well) Additional Comments: pt with walk in shower Prior Function Level of Independence: Independent (however reports being unsteady for some time) Communication Communication: No difficulties Dominant Hand: Right    Cognition  Cognition Arousal/Alertness: Awake/alert Behavior During Therapy: WFL for tasks assessed/performed Overall Cognitive Status: Within Functional Limits for tasks assessed    Extremity/Trunk Assessment Upper Extremity Assessment Upper Extremity Assessment: RUE deficits/detail RUE Deficits / Details: no ROM completed however able to wiggle fingers. Pt with noted edema.ace wrap re-applied for optimal swelling management Lower Extremity  Assessment Lower Extremity Assessment: Generalized weakness Cervical / Trunk Assessment Cervical / Trunk Assessment: Normal   Balance    End of Session    GP     Marcene Brawn 04/13/2013, 9:09 AM  Lewis Shock, PT, DPT Pager #: [phone] Office #: [phone]

## 2013-04-13 NOTE — Progress Notes (Signed)
[facility] Progress Note  Subjective  Doing fair Pain in R arm, reports more sore than anything else Oxy and dilaudid help Did not eat much this am Nauseated  Denies numbness or tingling in R arm  No change in baseline SOB  Did discuss advance directives, pt wishes to be DNR  Review of Systems  Constitutional: Negative for fever and chills.  Eyes: Negative for blurred vision.  Respiratory: Positive for shortness of breath. Negative for wheezing.        At baseline   Cardiovascular: Negative for chest pain and palpitations.  Gastrointestinal: Positive for nausea. Negative for vomiting and abdominal pain.  Genitourinary:       Foley  Neurological: Negative for tingling, sensory change and headaches.    Objective   BP 122/89  Pulse 121  Temp(Src) 99.3 F (37.4 C) (Oral)  Resp 20  Ht 5\' 6"  (1.676 m)  Wt 76.839 kg (169 lb 6.4 oz)  BMI 27.35 kg/m2  SpO2 100%  Intake/Output     01/05 0701 - 01/06 0700 01/06 0701 - 01/07 0700   I.V. (mL/kg) 2000 (26) 100 (1.3)   Total Intake(mL/kg) 2000 (26) 100 (1.3)   Urine (mL/kg/hr) 645 200 (1)   Blood 250    Total Output 895 200   Net +1105 -100          Labs Results for Fayne NorrieHUGHES, [name] A (MRN [id number]) as of 04/13/2013 09:32  Ref. Range 04/13/2013 06:20  Sodium Latest Range: 137-147 mEq/L 135 (L)  Potassium Latest Range: 3.7-5.3 mEq/L 5.0  Chloride Latest Range: 96-112 mEq/L 97  CO2 Latest Range: 19-32 mEq/L 27  BUN Latest Range: 6-23 mg/dL 12  Creatinine Latest Range: 0.50-1.10 mg/dL 4.091.21 (H)  Calcium Latest Range: 8.4-10.5 mg/dL 8.0 (L)  GFR calc non Af Amer Latest Range: >90 mL/min 47 (L)  GFR calc Af Amer Latest Range: >90 mL/min 54 (L)  Glucose Latest Range: 70-99 mg/dL 811116 (H)  WBC Latest Range: 4.0-10.5 K/uL 7.7  RBC Latest Range: 3.87-5.11 MIL/uL 2.43 (L)  Hemoglobin Latest Range: 12.0-15.0 g/dL 7.8 (L)  HCT Latest Range: 36.0-46.0 % 24.1 (L)  MCV Latest Range: 78.0-100.0 fL 99.2  MCH Latest  Range: 26.0-34.0 pg 32.1  MCHC Latest Range: 30.0-36.0 g/dL 91.432.4  RDW Latest Range: 11.5-15.5 % 13.6  Platelets Latest Range: 150-400 K/uL 252     Exam  Gen: sitting in bedside chair, appears stable Lungs: decreased throughout, no wheezes, rales or rhonchi Cardiac: tachy, s1 and s2, RRR Abd: +BS, NTND  Ext:       R upper extremity  Swelling controlled  Radial, ulnar, median nv sens intact  Axillary nv sensation intact  R/U/M/AIN/PIN motor intact  Ax motor grossly intact  Ext warm  + radial pulse  Sling and ace stable   Assessment and Plan   POD/HD#: 1   63 y/o RHD female s/p fall with R proximal 1/3 humeral shaft fx s/p ORIF  1. Fall 2. R proximal 1/3 humeral shaft fracture s/p ORIF  Can WB thru R upper extremity as needed for mobilization   Unrestricted ROM R wrist, forearm and elbow  FF of shoulder only and passive abduction   No active abduction of R shoulder  Ice prn   Sling for comfort  PT/OT eval   3. Pain management:  LFT's back to normal  Range  Will add ultram as additional agent for pain control  Pt would also like xanax restarted   4. ABL anemia/Hemodynamics  Fairly  modest drop in H/H from where we started, likely due to ASA therapy  Pt also tachy, likely  Related to pain and volume loss  Will give 1 unit of PRBC's today  Continue with IVF   Cbc in am   5. DVT/PE prophylaxis:  Mechanical for now  Will restart ASA once H/H stable  6. ID:   Completed periop abx    Pt with chronic Hep C   7. Metabolic Bone Disease:  Labs pending  8. Activity:  OOB with therapy   As per #2  9. FEN/Foley/Lines:  Advance diet as tolerated  Continue with foley for strict I&O's  10. Impediments to fracture healing:  Chronic disease  Nicotine dependence  11. Nicotine dependence  No nicotine supplements  Pt quit a few days ago  Continue to support  12. Advanced directives  Pt wishes to be DNR  Will have paperwork filled out   13. H/o  ARF  Renal function appear to be back at base line  Continue to monitor  14. General medical issues  Home meds  Watch BP  COPD meds   15. Dispo:  PT/OT consults  PRBC's today  Continue in SDU x 24 more hours    Mearl Latin, PA-C [facility] [phone] (P) 04/13/2013 9:30 AM

## 2013-04-14 ENCOUNTER — Encounter (HOSPITAL_COMMUNITY): Payer: Self-pay | Admitting: Orthopedic Surgery

## 2013-04-14 LAB — CBC
HCT: 26 % — ABNORMAL LOW (ref 36.0–46.0)
Hemoglobin: 8.6 g/dL — ABNORMAL LOW (ref 12.0–15.0)
MCH: 31.7 pg (ref 26.0–34.0)
MCHC: 33.1 g/dL (ref 30.0–36.0)
MCV: 95.9 fL (ref 78.0–100.0)
PLATELETS: 219 10*3/uL (ref 150–400)
RBC: 2.71 MIL/uL — ABNORMAL LOW (ref 3.87–5.11)
RDW: 14.8 % (ref 11.5–15.5)
WBC: 9.9 10*3/uL (ref 4.0–10.5)

## 2013-04-14 LAB — TYPE AND SCREEN
ABO/RH(D): A POS
Antibody Screen: NEGATIVE
UNIT DIVISION: 0

## 2013-04-14 LAB — BASIC METABOLIC PANEL
BUN: 15 mg/dL (ref 6–23)
CALCIUM: 8.6 mg/dL (ref 8.4–10.5)
CO2: 30 mEq/L (ref 19–32)
CREATININE: 1.28 mg/dL — AB (ref 0.50–1.10)
Chloride: 96 mEq/L (ref 96–112)
GFR, EST AFRICAN AMERICAN: 51 mL/min — AB (ref >90)
GFR, EST NON AFRICAN AMERICAN: 44 mL/min — AB (ref >90)
Glucose, Bld: 111 mg/dL — ABNORMAL HIGH (ref 70–99)
Potassium: 4.4 mEq/L (ref 3.7–5.3)
Sodium: 135 mEq/L — ABNORMAL LOW (ref 137–147)

## 2013-04-14 MED ORDER — IPRATROPIUM-ALBUTEROL 0.5-2.5 (3) MG/3ML IN SOLN: 3.0000 mL | RESPIRATORY_TRACT | Status: DC | PRN

## 2013-04-14 MED ORDER — IPRATROPIUM-ALBUTEROL 18-103 MCG/ACT IN AERO: 2.0000 | INHALATION_SPRAY | RESPIRATORY_TRACT | Status: DC | PRN

## 2013-04-14 NOTE — Care Management Note (Signed)
    Page 1 of 1   04/14/2013     11:45:09 AM   CARE MANAGEMENT NOTE 04/14/2013  Patient:  [name] [name],[name] [name]   Account Number:  [id number]  Date Initiated:  04/13/2013  Documentation initiated by:  Donn PieriniWEBSTER,Uchenna Rappaport  Subjective/Objective Assessment:   Pt admitted s/p ORIF right humerus     Action/Plan:   PTA pt lived at home- PT eval ordered- NCM to follow for [facility] and [facility]   Anticipated DC Date:  04/15/2013   Anticipated DC Plan:  HOME W HOME HEALTH SERVICES      DC Planning Services  CM consult      [facility] FAC Choice  HOME HEALTH   Choice offered to / List presented to:  C-1 Patient        HH arranged  HH-2 PT  HH-3 OT      Status of service:  In process, will continue to follow Medicare Important Message given?   (If response is "NO", the following Medicare IM given date fields will be blank) Date Medicare IM given:   Date Additional Medicare IM given:    Discharge Disposition:    Per UR Regulation:  Reviewed for med. necessity/level of care/duration of stay  If discussed at Long Length of Stay Meetings, dates discussed:    Comments:  04/14/13- 1140- Donn PieriniKristi Jarvin Ogren RN, BSN [phone] Referral received for HH-PT/OT- in to speak with pt at bedside- per conversation - [facility] [place] [facility] [place] given to pt- pt would like to call family to check on choice before making decision- attempted call -but line busy- pt to let CM know [facility]- per pt she does not need any DME for home. NCM to f[facility]

## 2013-04-14 NOTE — Progress Notes (Signed)
Occupational Therapy Evaluation Patient Details Name: [name] [name] MRN: [id number] DOB: 1950/04/27 Today's Date: 04/14/2013 Time: 4098-1191 OT Time Calculation (min): 34 min  OT Assessment / Plan / Recommendation History of present illness Pt s/p mechanical fall who suffered R humerus fx. Pt underwent ORIF to R humeral fx 1/5.   Clinical Impression   PTA. Pt lived alone and was independent with ADL and mobility. Pt states she has had several falls lately at home. Pt requires Mod - Max A with ADL and mod A with mobility. Pt is high risk for falls. Pt states that she will not have 24/7 S. Feel pt is unsafe to D/C home at this time. Rec that pt undergo rehab at SNF prior to return home. Began R UE exercise. Hand AROM, elbow AAROM, R shoulder FF AAROM to @ 45. PROM abduction - pt has difficulty not activating in abduction  - therefore limited this ex. Completed pendulums in sitting,as pt was unable to balance self in standing. Pt will benefit from skilled OT services to facilitate D/C to next venue due to below deficits.  OT Assessment  Patient needs continued OT Services    Follow Up Recommendations  Supervision/Assistance - 24 hour;SNF    Barriers to Discharge Decreased caregiver support Pt states she will not have 24/7 assistance. States that when her friend's kids have to go to school (21 and 44), thaqt she will be by herself for 2-3 hours  Equipment Recommendations  Other (comment) (TBD)    Recommendations for Other Services    Frequency  Min 3X/week    Precautions / Restrictions Precautions Precautions: Fall Precaution Comments: increased HR into 140s-150s with mvmt, most likely due to pain. decreased to 110s-120s at rest. Restrictions Weight Bearing Restrictions: Yes RUE Weight Bearing: Non weight bearing Other Position/Activity Restrictions: per MD order pt can WBAT to mobilize; no active Rt shoulder abduction    Pertinent Vitals/Pain C/o R shoulder pain. Positioned with  ice.  Tachy. O2 dropping with exertion to @ 85 2L.   ADL  Eating/Feeding: Set up Where Assessed - Eating/Feeding: Chair Grooming: Moderate assistance Where Assessed - Grooming: Unsupported sitting Upper Body Bathing: Moderate assistance Where Assessed - Upper Body Bathing: Unsupported sitting Lower Body Bathing: Maximal assistance Where Assessed - Lower Body Bathing: Supported sit to stand Upper Body Dressing: Maximal assistance Where Assessed - Upper Body Dressing: Unsupported sitting Lower Body Dressing: Maximal assistance Where Assessed - Lower Body Dressing: Supported sit to Pharmacist, hospital: Moderate assistance Toilet Transfer Method: Sit to stand Toileting - Clothing Manipulation and Hygiene: Moderate assistance Where Assessed - Toileting Clothing Manipulation and Hygiene: Sit to stand from 3-in-1 or toilet Transfers/Ambulation Related to ADLs: mod A from chair. Extremely unsteady ADL Comments: RUE rewrapped due to edematous R hand and UE. Rewrapped using comprerssion technique    OT Diagnosis: Generalized weakness;Acute pain  OT Problem List: Decreased strength;Decreased range of motion;Decreased activity tolerance;Impaired balance (sitting and/or standing);Decreased coordination;Decreased safety awareness;Decreased knowledge of use of DME or AE;Decreased knowledge of precautions;Obesity;Pain;Impaired UE functional use;Increased edema OT Treatment Interventions: Self-care/ADL training;Therapeutic exercise;Energy conservation;DME and/or AE instruction;Therapeutic activities;Patient/family education;Balance training   OT Goals(Current goals can be found in the care plan section) Acute Rehab OT Goals Patient Stated Goal: home OT Goal Formulation: With patient Time For Goal Achievement: 04/28/13 Potential to Achieve Goals: Good  Visit Information  Last OT Received On: 04/14/13 Assistance Needed: +1 History of Present Illness: Pt s/p mechanical fall who suffered R humerus  fx. Pt underwent ORIF to  R humeral fx 1/5.       Prior Functioning     Home Living Family/patient expects to be discharged to:: Private residence Living Arrangements: Alone Available Help at Discharge: Family;Available 24 hours/day Type of Home: House Home Access: Ramped entrance Home Layout: Two level Alternate Level Stairs-Number of Steps: 12 (has lift chair to negotiate stairs) Home Equipment: Bedside commode;Shower seat;Wheelchair - manual (lift chair, lift chair for stair as well) Additional Comments: pt with walk in shower Prior Function Level of Independence: Independent (however reports being unsteady for some time) Communication Communication: No difficulties Dominant Hand: Right         Vision/Perception     Cognition  Cognition Arousal/Alertness: Awake/alert Behavior During Therapy: WFL for tasks assessed/performed Overall Cognitive Status: Within Functional Limits for tasks assessed    Extremity/Trunk Assessment Upper Extremity Assessment Upper Extremity Assessment: RUE deficits/detail RUE Deficits / Details: allowed AROM hand,wrist,elbow. FFshoulder only within pain tolerance. PROM ONLY shoulder abduction.  RUE Coordination: decreased fine motor;decreased gross motor Lower Extremity Assessment Lower Extremity Assessment: Generalized weakness (R knee gives away) Cervical / Trunk Assessment Cervical / Trunk Assessment: Normal     Mobility Bed Mobility Overal bed mobility: Modified Independent General bed mobility comments: pt relies on handrail and HOB elevated to negotiate  Transfers Overall transfer level: Needs assistance Equipment used: 1 person hand held assist Transfers: Sit to/from Stand Sit to Stand: Mod assist (from recliner) General transfer comment: losing balance posteriorly     Exercise Other Exercises Other Exercises: retrograde massage R hand. rewrapped   Balance Balance Overall balance assessment: Needs assistance;History of  Falls Sitting-balance support: Feet supported;Bilateral upper extremity supported Sitting balance-Leahy Scale: Fair Postural control: Posterior lean Standing balance support: During functional activity;Single extremity supported;Bilateral upper extremity supported Standing balance-Leahy Scale: Poor - high risk for falls General Comments General comments (skin integrity, edema, etc.): edematous RUE/hand   End of Session OT - End of Session Activity Tolerance: Patient tolerated treatment well Patient left: in chair;with call bell/phone within reach;Other (comment) (with RUE elevated) Nurse Communication: Mobility status;Other (comment);Weight bearing status;Precautions (D/C concerns)  GO     Myha Arizpe,HILLARY 04/14/2013, 3:53 PM [facility] Brean Carberry, OTR/L  [phone] 04/14/2013

## 2013-04-14 NOTE — Progress Notes (Signed)
Orthopaedic Trauma Service Progress Note  Subjective  Doing better this am Pain improving Tolerated PRBC's yesterday  No new issues   Review of Systems  Constitutional: Negative for fever and chills.  Respiratory: Negative for cough and wheezing.   Cardiovascular: Negative for chest pain and palpitations.  Gastrointestinal: Negative for nausea, vomiting and abdominal pain.  Neurological: Positive for headaches. Negative for tingling and sensory change.     Objective   BP 116/87  Pulse 98  Temp(Src) 98.3 F (36.8 C) (Oral)  Resp 14  Ht 5\' 6"  (1.676 m)  Wt 76.839 kg (169 lb 6.4 oz)  BMI 27.35 kg/m2  SpO2 98%  Intake/Output     01/06 0701 - 01/07 0700 01/07 0701 - 01/08 0700   P.O. 360    I.V. (mL/kg) 150 (2)    Blood 313    IV Piggyback 50    Total Intake(mL/kg) 873 (11.4)    Urine (mL/kg/hr) 2200 (1.2) 350 (2.3)   Blood     Total Output 2200 350   Net -1327 -350          Labs  Results for EVALINE, WALTMAN (MRN [id number]) as of 04/14/2013 08:58  Ref. Range 04/14/2013 02:30  Sodium Latest Range: 137-147 mEq/L 135 (L)  Potassium Latest Range: 3.7-5.3 mEq/L 4.4  Chloride Latest Range: 96-112 mEq/L 96  CO2 Latest Range: 19-32 mEq/L 30  BUN Latest Range: 6-23 mg/dL 15  Creatinine Latest Range: 0.50-1.10 mg/dL 4.09 (H)  Calcium Latest Range: 8.4-10.5 mg/dL 8.6  GFR calc non Af Amer Latest Range: >90 mL/min 44 (L)  GFR calc Af Amer Latest Range: >90 mL/min 51 (L)  Glucose Latest Range: 70-99 mg/dL 811 (H)  WBC Latest Range: 4.0-10.5 K/uL 9.9  RBC Latest Range: 3.87-5.11 MIL/uL 2.71 (L)  Hemoglobin Latest Range: 12.0-15.0 g/dL 8.6 (L)  HCT Latest Range: 36.0-46.0 % 26.0 (L)  MCV Latest Range: 78.0-100.0 fL 95.9  MCH Latest Range: 26.0-34.0 pg 31.7  MCHC Latest Range: 30.0-36.0 g/dL 91.4  RDW Latest Range: 11.5-15.5 % 14.8  Platelets Latest Range: 150-400 K/uL 219    Results for LYNSI, DOONER (MRN [id number]) as of 04/14/2013 08:58  Ref. Range 04/13/2013  06:20  Vit D, 25-Hydroxy Latest Range: 30-89 ng/mL 40    Exam  Gen: awake and alert, appears more comfortable today Lungs: clear anteriorly  Cardiac: s1 and s2, RRR Abd: + BS, NT Ext:    R upper extremity             Swelling controlled             Radial, ulnar, median nv sens intact             Axillary nv sensation intact             R/U/M/AIN/PIN motor intact             Ax motor grossly intact             Ext warm             + radial pulse             Sling and ace stable    Assessment and Plan     POD/HD#: 2   63 y/o RHD female s/p fall with R proximal 1/3 humeral shaft fx s/p ORIF  1. Fall 2. R proximal 1/3 humeral shaft fracture s/p ORIF             Can WB thru R  upper extremity as needed for mobilization               Unrestricted ROM R wrist, forearm and elbow             FF of shoulder only and passive abduction               No active abduction of R shoulder  Shoulder pendulums ok              Ice prn               Sling for comfort             PT/OT eval   3. Pain management:            continue with current regimen  4. ABL anemia/Hemodynamics            improved  Monitor              Cbc in am   5. DVT/PE prophylaxis:             Mechanical for now             Will restart ASA once H/H stable  6. ID:               Completed periop abx                          Pt with chronic Hep C   7. Metabolic Bone Disease:           Vitamin D level looks good  8. Activity:             OOB with therapy               As per #2  9. FEN/Foley/Lines:             Advance diet as tolerated             dc foley   10. Impediments to fracture healing:             Chronic disease             Nicotine dependence  11. Nicotine dependence             No nicotine supplements             Pt quit a few days ago             Continue to support  12. Advanced directives            pt DNR  13. H/o ARF             Slight increase in Cr. BUN stable  Will  continue with gentle hydration              Continue to monitor  14. General medical issues             Home meds             Watch BP             COPD meds              15. Dispo:             PT/OT consults             transfer to Ortho floor   Possible dc home tomorrow or Friday  Mearl Latin, PA-C [facility] [phone] (P) 04/14/2013 8:57 AM

## 2013-04-14 NOTE — Progress Notes (Signed)
Physical Therapy Treatment Patient Details Name: [name] [name] MRN: [id number] DOB: 1950-06-01 Today's Date: 04/14/2013 Time: [id number] PT Time Calculation (min): 27 min  PT Assessment / Plan / Recommendation  History of Present Illness Pt s/p mechanical fall who suffered R humerus fx. Pt underwent ORIF to R humeral fx 1/5.   PT Comments   Pt able to progress ambulation today with min (A). Benefits from RW to increase stability; pt with very unsteady gt. Per MD note is able to WB as needed through Rt UE for mobilization. Pt educated on ROM precautions and reviewed thoroughly. Pt adamant against refusing SNF. Reports she will have 24/7 (A) upon D/C. Will need (A) for all mobility upon acute D/C. Will cont to follow per POC.  Follow Up Recommendations  Home health PT;Supervision/Assistance - 24 hour     Does the patient have the potential to tolerate intense rehabilitation     Barriers to Discharge        Equipment Recommendations  Rolling walker with 5" wheels    Recommendations for Other Services OT consult  Frequency Min 4X/week   Progress towards PT Goals Progress towards PT goals: Progressing toward goals  Plan Current plan remains appropriate    Precautions / Restrictions Precautions Precautions: Fall Precaution Comments: pt tachy with activity; most likely due to pain and anxiety; HR reached 150's  Restrictions Weight Bearing Restrictions: Yes RUE Weight Bearing: Non weight bearing Other Position/Activity Restrictions: per MD order pt can WBAT to mobilize; no active Rt shoulder abduction    Pertinent Vitals/Pain See VSS. Pt HR tachy with activity; reached 150's. O2 on 2L desat with ambulation; ranged from 78% to 96%    Mobility  Bed Mobility Overal bed mobility: Modified Independent General bed mobility comments: pt relies on handrail and HOB elevated to negotiate  Transfers Overall transfer level: Needs assistance Equipment used: 1 person hand held  assist Transfers: Sit to/from Stand Sit to Stand: Min assist General transfer comment: pt requries (A) to maintain balance with transfers; pt pushing posteriorly; cues for upright posture; pt tends to keep trunk forwardly flexed Ambulation/Gait Ambulation/Gait assistance: Min assist Ambulation Distance (Feet): 40 Feet (x2 required sitting rest break) Assistive device: None;Rolling walker (2 wheeled) Gait Pattern/deviations: Decreased stride length;Shuffle;Trunk flexed;Wide base of support;Leaning posteriorly Gait velocity: decreased Gait velocity interpretation: <1.8 ft/sec, indicative of risk for recurrent falls General Gait Details: pt pushing posteriorly throughout ambulating; initially ambulated with hand held (A); pt very unsteady; benefits from RW for mobility to increase stability with gt; pt reports she is not fully WB through Rt LE; encouraged to use Rt UE for guidance vs. WB; pt fatigued quickly; O2 stats on 2L O2 ranged from 78%-96%; cues for deep breathing throghout session    Exercises     PT Diagnosis:    PT Problem List:   PT Treatment Interventions:     PT Goals (current goals can now be found in the care plan section) Acute Rehab PT Goals Patient Stated Goal: home PT Goal Formulation: With patient Time For Goal Achievement: 04/20/13 Potential to Achieve Goals: Good  Visit Information  Last PT Received On: 04/14/13 Assistance Needed: +2 (for ambulating ) History of Present Illness: Pt s/p mechanical fall who suffered R humerus fx. Pt underwent ORIF to R humeral fx 1/5.    Subjective Data  Subjective: Pt lying supine; agreeable to therapy. stated she had been up today. plans to D/C home with family/friends  Patient Stated Goal: home   Cognition  Cognition  Arousal/Alertness: Awake/alert Behavior During Therapy: WFL for tasks assessed/performed Overall Cognitive Status: Within Functional Limits for tasks assessed    Balance  Balance Overall balance assessment:  Needs assistance;History of Falls Sitting-balance support: Single extremity supported;Feet supported Sitting balance-Leahy Scale: Good Postural control: Posterior lean Standing balance support: During functional activity;Single extremity supported;Bilateral upper extremity supported Standing balance-Leahy Scale: Poor  End of Session PT - End of Session Equipment Utilized During Treatment: Gait belt;Oxygen Activity Tolerance: Patient tolerated treatment well Patient left: in chair;with call bell/phone within reach;Other (comment) (with OT) Nurse Communication: Mobility status   GP     Donell Sievert, [place] [phone] 04/14/2013, 3:12 PM

## 2013-04-14 NOTE — Progress Notes (Signed)
Occupational Therapy Treatment Patient Details Name: [name] [name] MRN: [id number] DOB: [birth year] Today's Date: 04/14/2013 Time: 1914-7829 OT Time Calculation (min): 15 min  OT Assessment / Plan / Recommendation  History of present illness Pt s/p mechanical fall who suffered R humerus fx. Pt underwent ORIF to R humeral fx 1/5.   OT comments  Given written exercise guilde lines. Given exercise ball. Repostioned in bed to reduce dependent edema. Will see tomorrow to continue to progress toward goals  Follow Up Recommendations  Supervision/Assistance - 24 hour;SNF    Barriers to Discharge  Decreased caregiver support Pt states she will not have 24/7 assistance. States that when her friend's kids have to go to school (21 and 44), thaqt she will be by herself for 2-3 hours  Equipment Recommendations  Other (comment) (TBD)    Recommendations for Other Services    Frequency Min 3X/week   Progress towards OT Goals Progress towards OT goals: Progressing toward goals  Plan Discharge plan remains appropriate    Precautions / Restrictions Precautions Precautions: Fall Precaution Comments: humerus fracture Required Braces or Orthoses: Sling Restrictions Weight Bearing Restrictions: Yes RUE Weight Bearing: Non weight bearing Other Position/Activity Restrictions: per MD order pt can WBAT to mobilize; no active Rt shoulder abduction    Pertinent Vitals/Pain no apparent distress     ADL  Eating/Feeding:  (given theratubing for utensils) Where Assessed - Eating/Feeding: Chair Grooming: Moderate assistance Where Assessed - Grooming: Unsupported sitting Upper Body Bathing: Moderate assistance Where Assessed - Upper Body Bathing: Unsupported sitting Lower Body Bathing: Maximal assistance Where Assessed - Lower Body Bathing: Supported sit to stand Upper Body Dressing: Maximal assistance Where Assessed - Upper Body Dressing: Unsupported sitting Lower Body Dressing: Maximal  assistance Where Assessed - Lower Body Dressing: Supported sit to Pharmacist, hospital: Moderate assistance Toilet Transfer Method: Sit to stand Toileting - Clothing Manipulation and Hygiene: Moderate assistance Where Assessed - Toileting Clothing Manipulation and Hygiene: Sit to stand from 3-in-1 or toilet Transfers/Ambulation Related to ADLs: mod A from chair. Extremely unsteady ADL Comments: Pt in bed. Repositioned RUE so as not in a dependent position.    OT Diagnosis: Generalized weakness;Acute pain  OT Problem List: Decreased strength;Decreased range of motion;Decreased activity tolerance;Impaired balance (sitting and/or standing);Decreased coordination;Decreased safety awareness;Decreased knowledge of use of DME or AE;Decreased knowledge of precautions;Obesity;Pain;Impaired UE functional use;Increased edema OT Treatment Interventions: Self-care/ADL training;Therapeutic exercise;Energy conservation;DME and/or AE instruction;Therapeutic activities;Patient/family education;Balance training   OT Goals(current goals can now be found in the care plan section) Acute Rehab OT Goals Patient Stated Goal: home OT Goal Formulation: With patient Time For Goal Achievement: 04/28/13 Potential to Achieve Goals: Good ADL Goals Pt Will Perform Grooming: with supervision;with set-up;sitting Pt Will Perform Upper Body Bathing: with min assist;sitting Pt/caregiver will Perform Home Exercise Program: Right Upper extremity;With Supervision;With written HEP provided;Increased ROM Additional ADL Goal #1: Pt will be independent in guiding staqff in proper positioning and edema control RUE Additional ADL Goal #2: complete functional mobility for ADL at RW level with S  Visit Information  Last OT Received On: 04/14/13 Assistance Needed: +1 History of Present Illness: Pt s/p mechanical fall who suffered R humerus fx. Pt underwent ORIF to R humeral fx 1/5.    Subjective Data      Prior Functioning  Home  Living Family/patient expects to be discharged to:: Private residence Living Arrangements: Alone Available Help at Discharge: Family;Available 24 hours/day Type of Home: House Home Access: Ramped entrance Home Layout: Two level Alternate Level Stairs-Number  of Steps: 12 (has lift chair to negotiate stairs) Home Equipment: Bedside commode;Shower seat;Wheelchair - manual (lift chair, lift chair for stair as well) Additional Comments: pt with walk in shower Prior Function Level of Independence: Independent (however reports being unsteady for some time) Communication Communication: No difficulties Dominant Hand: Right    Cognition  Cognition Arousal/Alertness: Awake/alert Behavior During Therapy: WFL for tasks assessed/performed Overall Cognitive Status: Within Functional Limits for tasks assessed    Mobility  Bed Mobility Overal bed mobility: Modified Independent General bed mobility comments: pt relies on handrail and HOB elevated to negotiate  Transfers Overall transfer level: Needs assistance Equipment used: 1 person hand held assist Transfers: Sit to/from Stand Sit to Stand: Mod assist (from recliner) General transfer comment: losing balance posteriorly    Exercises  Other Exercises Other Exercises: given exercise ball Other Exercises: given written exercise guildelines   Balance Balance Overall balance assessment: Needs assistance;History of Falls Sitting-balance support: Feet supported;Bilateral upper extremity supported Sitting balance-Leahy Scale: Fair Postural control: Posterior lean Standing balance support: During functional activity;Single extremity supported;Bilateral upper extremity supported Standing balance-Leahy Scale: Poor General Comments General comments (skin integrity, edema, etc.): edematous RUE/hand  End of Session OT - End of Session Activity Tolerance: Patient tolerated treatment well Patient left: in bed;with call bell/phone within reach Nurse  Communication: Mobility status;Weight bearing status  GO     Hashem Goynes,HILLARY 04/14/2013, 5:23 PM [facility] Lamarcus Spira, OTR/L  [phone] 04/14/2013

## 2013-04-15 LAB — CBC
HCT: 27.8 % — ABNORMAL LOW (ref 36.0–46.0)
Hemoglobin: 9 g/dL — ABNORMAL LOW (ref 12.0–15.0)
MCH: 31.7 pg (ref 26.0–34.0)
MCHC: 32.4 g/dL (ref 30.0–36.0)
MCV: 97.9 fL (ref 78.0–100.0)
Platelets: 219 10*3/uL (ref 150–400)
RBC: 2.84 MIL/uL — AB (ref 3.87–5.11)
RDW: 13.7 % (ref 11.5–15.5)
WBC: 8 10*3/uL (ref 4.0–10.5)

## 2013-04-15 LAB — POCT I-STAT 7, (LYTES, BLD GAS, ICA,H+H)
Acid-Base Excess: 6 mmol/L — ABNORMAL HIGH (ref 0.0–2.0)
Bicarbonate: 30.9 mEq/L — ABNORMAL HIGH (ref 20.0–24.0)
Calcium, Ion: 1.19 mmol/L (ref 1.13–1.30)
HEMATOCRIT: 28 % — AB (ref 36.0–46.0)
Hemoglobin: 9.5 g/dL — ABNORMAL LOW (ref 12.0–15.0)
O2 Saturation: 99 %
PH ART: 7.43 (ref 7.350–7.450)
POTASSIUM: 3.5 meq/L — AB (ref 3.7–5.3)
SODIUM: 137 meq/L (ref 137–147)
TCO2: 32 mmol/L (ref 0–100)
pCO2 arterial: 46.6 mmHg — ABNORMAL HIGH (ref 35.0–45.0)
pO2, Arterial: 129 mmHg — ABNORMAL HIGH (ref 80.0–100.0)

## 2013-04-15 LAB — BASIC METABOLIC PANEL
BUN: 14 mg/dL (ref 6–23)
CALCIUM: 8.4 mg/dL (ref 8.4–10.5)
CO2: 22 mEq/L (ref 19–32)
Chloride: 96 mEq/L (ref 96–112)
Creatinine, Ser: 1.13 mg/dL — ABNORMAL HIGH (ref 0.50–1.10)
GFR calc Af Amer: 59 mL/min — ABNORMAL LOW (ref >90)
GFR, EST NON AFRICAN AMERICAN: 51 mL/min — AB (ref >90)
Glucose, Bld: 95 mg/dL (ref 70–99)
POTASSIUM: 5 meq/L (ref 3.7–5.3)
SODIUM: 132 meq/L — AB (ref 137–147)

## 2013-04-15 MED ORDER — ASPIRIN 325 MG PO TABS
325.0000 mg | ORAL_TABLET | Freq: Every day | ORAL | Status: DC
  Administered 2013-04-15 – 2013-04-16 (×2): 325 mg via ORAL
  Filled 2013-04-15 (×2): qty 1

## 2013-04-15 MED ORDER — POLYETHYLENE GLYCOL 3350 17 G PO PACK
17.0000 g | PACK | Freq: Every day | ORAL | Status: DC
  Administered 2013-04-15: 17 g via ORAL
  Filled 2013-04-15 (×3): qty 1

## 2013-04-15 MED ORDER — MAGNESIUM CITRATE PO SOLN: 0.5000 | Freq: Once | ORAL | Status: AC | PRN

## 2013-04-15 NOTE — H&P (Signed)
I have seen and examined the patient. I agree with the findings above. Please see other dictation.  Budd PalmerHANDY,Alekzander Cardell H, MD 04/15/2013 11:13 AM

## 2013-04-15 NOTE — Progress Notes (Signed)
I have seen and examined the patient. I agree with the findings above.  Budd PalmerHANDY,Alexxus Sobh H, MD 04/15/2013 11:09 AM

## 2013-04-15 NOTE — Progress Notes (Signed)
Occupational Therapy Treatment Patient Details Name: [name] [name] MRN: [id number] DOB: 03-12-1951 Today's Date: 04/15/2013 Time: [id number] OT Time Calculation (min): 36 min  OT Assessment / Plan / Recommendation  History of present illness Pt s/p mechanical fall who suffered R humerus fx. Pt underwent ORIF to R humeral fx 1/5.   OT comments  This 63 yo making progress and would benefit from OT at SNF due to arm and balance deficits; however pt is refusing SNF and says someone will be with her all but 1 1/2-2 hours a day (have told her she should not get up when she is alone and she verbalizes understanding). Since pt is refusing SNF, then recommend HHOT,  Follow Up Recommendations  SNF;Supervision/Assistance - 24 hour (but pt refusing SNF, so will need HHOT)       Equipment Recommendations   (pt states she has a 3n1 and a shower seat)       Frequency Min 3X/week   Progress towards OT Goals Progress towards OT goals: Progressing toward goals  Plan Discharge plan remains appropriate    Precautions / Restrictions Precautions Precautions: Fall Precaution Comments: humerus fracture Required Braces or Orthoses: Sling Restrictions RUE Weight Bearing: Non weight bearing Other Position/Activity Restrictions: per MD order pt can WBAT to mobilize; no active Rt shoulder abduction    Pertinent Vitals/Pain RN in at end of session to give pain meds (they were due then)    ADL  Toilet Transfer: Minimal assistance Toilet Transfer Method: Sit to stand;Stand pivot Toilet Transfer Equipment:  (Bed to recliner going to her left with HHA) Transfers/Ambulation Related to ADLs: Min A sit>stand from bed, min A stand>sit to recliner--still quite unsteady        OT Goals(current goals can now be found in the care plan section)    Visit Information  Last OT Received On: 04/15/13 Assistance Needed: +1 History of Present Illness: Pt s/p mechanical fall who suffered R humerus fx. Pt underwent  ORIF to R humeral fx 1/5.          Cognition  Cognition Arousal/Alertness: Awake/alert Behavior During Therapy: WFL for tasks assessed/performed Overall Cognitive Status: Impaired/Different from baseline Area of Impairment: Problem solving Problem Solving: Requires verbal cues (for getting up OOB)    Mobility  Bed Mobility Overal bed mobility: Needs Assistance Bed Mobility: Sit to Supine Sit to supine: Min assist General bed mobility comments: without handrail, legs over the edge of bed first then up with trunk getting out on the left hand side of bed Transfers Overall transfer level: Needs assistance Equipment used: 1 person hand held assist Transfers: Sit to/from UGI CorporationStand;Stand Pivot Transfers Sit to Stand: Min assist Stand pivot transfers: Min assist General transfer comment: losing balance posteriorly    Exercises  Other Exercises Other Exercises: Supine: Had pt perform 10 reps of AROM for elbow and hand; AAROM for shoulder flexion to 45 degrees; and PROM for shoulder abduction to 45 degrees. Pt tolerated well. Had her stand at EOB to do pendulums, but because of her bad knees she could not so did gentle AAROM pendulums in standing.      End of Session OT - End of Session Equipment Utilized During Treatment: Gait belt (sling for RUE) Activity Tolerance: Patient tolerated treatment well Patient left: in chair;with call bell/phone within reach       Evette GeorgesLeonard, [place] Leap Eva [phone] 04/15/2013, 12:05 PM

## 2013-04-15 NOTE — Care Management Note (Signed)
:    04/15/13 1:20pm Vance PeperSusan Tammie Ellsworth, RN BSN Case Manager  spoke with patient concerning Home Health needs. She selected [facility], CM had to explain that BC/BS is out of network with [facility]. [facility]. [facility]. Pateint states she has rolling walker and 3in1. Patient may require SNF.

## 2013-04-15 NOTE — Progress Notes (Signed)
Physical Therapy Treatment Patient Details Name: [name] Norrieatricia A Redmond MRN: [id number] DOB: 03-11-1951 Today's Date: 04/15/2013 Time: [id number] PT Time Calculation (min): 23 min  PT Assessment / Plan / Recommendation  History of Present Illness Pt s/p mechanical fall who suffered R humerus fx. Pt underwent ORIF to R humeral fx 1/5.   PT Comments   Pt agreeable to participate in therapy.  Presents at min assist level for sit<>stand transfers & min guard for ambulation.     Follow Up Recommendations  Home health PT;Supervision/Assistance - 24 hour     Does the patient have the potential to tolerate intense rehabilitation     Barriers to Discharge        Equipment Recommendations  Rolling walker with 5" wheels    Recommendations for Other Services    Frequency Min 4X/week   Progress towards PT Goals Progress towards PT goals: Progressing toward goals  Plan Current plan remains appropriate    Precautions / Restrictions Precautions Precautions: Fall Precaution Comments: humerus fracture Required Braces or Orthoses: Sling Restrictions RUE Weight Bearing: Non weight bearing Other Position/Activity Restrictions: per MD order pt can WBAT to mobilize; no active Rt shoulder abduction        Mobility  Bed Mobility Overal bed mobility: Needs Assistance Bed Mobility: Sit to Supine Sit to supine: Min assist General bed mobility comments: without handrail, legs over the edge of bed first then up with trunk getting out on the left hand side of bed Transfers Overall transfer level: Needs assistance Equipment used: Rolling walker (2 wheeled) Transfers: Sit to/from Stand Sit to Stand: Min assist Stand pivot transfers: Min assist General transfer comment: (A) to power up to standing, balance, & safety.  Pt did well with adhering to NWBing RUE Ambulation/Gait Ambulation/Gait assistance: Min guard Ambulation Distance (Feet): 25 Feet Assistive device: Rolling walker (2 wheeled) Gait  Pattern/deviations: Decreased step length - right;Decreased step length - left;Shuffle;Trunk flexed Gait velocity: decreased General Gait Details: Guarding for safety.  Cues for safe use of RW, posture, & to stay closer to walker.  Pt with small shuffling steps but she states this is normal for her due to "bad" knees.      Exercises Other Exercises Other Exercises: Supine: Had pt perform 10 reps of AROM for elbow and hand; AAROM for shoulder flexion to 45 degrees; and PROM for shoulder abduction to 45 degrees. Pt tolerated well. Had her stand at EOB to do pendulums, but because of her bad knees she could not so did gentle AAROM pendulums in standing.     PT Goals (current goals can now be found in the care plan section) Acute Rehab PT Goals PT Goal Formulation: With patient Time For Goal Achievement: 04/20/13 Potential to Achieve Goals: Good  Visit Information  Last PT Received On: 04/15/13 Assistance Needed: +1 History of Present Illness: Pt s/p mechanical fall who suffered R humerus fx. Pt underwent ORIF to R humeral fx 1/5.    Subjective Data      Cognition  Cognition Arousal/Alertness: Awake/alert Behavior During Therapy: WFL for tasks assessed/performed Overall Cognitive Status: Within Functional Limits for tasks assessed Area of Impairment: Problem solving Problem Solving: Requires verbal cues (for getting up OOB)    Balance     End of Session PT - End of Session Activity Tolerance: Patient tolerated treatment well Patient left: in chair;with call bell/phone within reach Nurse Communication: Mobility status   GP     Lara MulchCooper, Shammond Arave Lynn 04/15/2013, 2:37 PM  Verdell FaceKelly Lundon Rosier, PTA  [phone] 04/15/2013    

## 2013-04-15 NOTE — Progress Notes (Signed)
Orthopaedic [facility]uma [facility] Progress Note  Subjective  Doing better No new issues  Pain slowly improving Appetite improving Wants to try reg diet this afternoon  No new complaints  Review of Systems  Constitutional: Negative for fever and chills.  Eyes: Negative for blurred vision.  Respiratory: Negative for cough and wheezing.   Cardiovascular: Negative for chest pain.  Gastrointestinal: Positive for constipation. Negative for nausea and abdominal pain.  Genitourinary: Negative for dysuria.  Neurological: Negative for tingling, sensory change and headaches.    Objective   BP 125/98  Pulse 120  Temp(Src) 98.7 F (37.1 C) (Oral)  Resp 18  Ht 5\' 6"  (1.676 m)  Wt 76.839 kg (169 lb 6.4 oz)  BMI 27.35 kg/m2  SpO2 92%  Intake/Output     01/07 0701 - 01/08 0700 01/08 0701 - 01/09 0700   P.O. 240    I.V. (mL/kg) 1226.7 (16)    Blood     IV Piggyback     Total Intake(mL/kg) 1466.7 (19.1)    Urine (mL/kg/hr) 600 (0.3)    Total Output 600     Net +866.7            Labs  Results for YASLENE, LINDAMOOD (MRN [id number]) as of 04/15/2013 09:12  Ref. Range 04/15/2013 04:55  Sodium Latest Range: 137-147 mEq/L 132 (L)  Potassium Latest Range: 3.7-5.3 mEq/L 5.0  Chloride Latest Range: 96-112 mEq/L 96  CO2 Latest Range: 19-32 mEq/L 22  BUN Latest Range: 6-23 mg/dL 14  Creatinine Latest Range: 0.50-1.10 mg/dL 1.47 (H)  Calcium Latest Range: 8.4-10.5 mg/dL 8.4  GFR calc non Af Amer Latest Range: >90 mL/min 51 (L)  GFR calc Af Amer Latest Range: >90 mL/min 59 (L)  Glucose Latest Range: 70-99 mg/dL 95  WBC Latest Range: 8.2-95.6 K/uL 8.0  RBC Latest Range: 3.87-5.11 MIL/uL 2.84 (L)  Hemoglobin Latest Range: 12.0-15.0 g/dL 9.0 (L)  HCT Latest Range: 36.0-46.0 % 27.8 (L)  MCV Latest Range: 78.0-100.0 fL 97.9  MCH Latest Range: 26.0-34.0 pg 31.7  MCHC Latest Range: 30.0-36.0 g/dL 21.3  RDW Latest Range: 11.5-15.5 % 13.7  Platelets Latest Range: 150-400 K/uL 219     Exam    Gen: awake and alert, resting comfortably in bed, in better spirits today  Lungs: clear anteriorly   Cardiac: s1 and s2, RRR Abd: + BS, NT Ext:     R upper extremity             Swelling controlled             Radial, ulnar, median nv sens intact             Axillary nv sensation intact             R/U/M/AIN/PIN motor intact             Ax motor grossly intact             Ext warm             + radial pulse             Sling and ace stable  Good active motion with hand   Assessment and Plan   POD/HD#: 35    63 y/o RHD female s/p fall with R proximal 1/3 humeral shaft fx s/p ORIF  1. Fall 2. R proximal 1/3 humeral shaft fracture s/p ORIF             Can WB thru R upper extremity as needed  for mobilization               Unrestricted ROM R wrist, forearm and elbow             FF of shoulder only and passive abduction               No active abduction of R shoulder             Shoulder pendulums ok               Ice prn               Sling for comfort             PT/OT   Dressing changes as needed  3. Pain management:            continue with current regimen  4. ABL anemia/Hemodynamics            improved             Monitor               Cbc in am   5. DVT/PE prophylaxis:             Mechanical for now             Will restart ASA   6. ID:               Completed periop abx                          Pt with chronic Hep C   7. Metabolic Bone Disease:             Vitamin D level looks good  Vitamin d and calcium supplementation per age recommendations   8. Activity:             OOB with therapy               As per #2  9. FEN/Foley/Lines:             Advance diet   Hyponatremia- no symptoms, NSL IVF            10. Impediments to fracture healing:             Chronic disease             Nicotine dependence  11. Nicotine dependence             No nicotine supplements             Pt quit a few days ago             Continue to  support  12. Advanced directives            pt DNR  13. H/o ARF             Cr back to baseline                Continue to monitor  14. General medical issues             Home meds             Watch BP             COPD meds              15. Dispo:             PT/OT  dc home tomorrow   Mearl Latin, PA-C [facility] [phone] (P) 04/15/2013 9:11 AM

## 2013-04-16 ENCOUNTER — Encounter (HOSPITAL_COMMUNITY): Payer: Self-pay | Admitting: Orthopedic Surgery

## 2013-04-16 DIAGNOSIS — J449 Chronic obstructive pulmonary disease, unspecified: Secondary | ICD-10-CM | POA: Diagnosis present

## 2013-04-16 DIAGNOSIS — F329 Major depressive disorder, single episode, unspecified: Secondary | ICD-10-CM | POA: Diagnosis present

## 2013-04-16 DIAGNOSIS — I10 Essential (primary) hypertension: Secondary | ICD-10-CM | POA: Diagnosis present

## 2013-04-16 DIAGNOSIS — F419 Anxiety disorder, unspecified: Secondary | ICD-10-CM | POA: Diagnosis present

## 2013-04-16 DIAGNOSIS — F32A Depression, unspecified: Secondary | ICD-10-CM | POA: Diagnosis present

## 2013-04-16 DIAGNOSIS — K759 Inflammatory liver disease, unspecified: Secondary | ICD-10-CM | POA: Diagnosis present

## 2013-04-16 DIAGNOSIS — E559 Vitamin D deficiency, unspecified: Secondary | ICD-10-CM | POA: Diagnosis present

## 2013-04-16 DIAGNOSIS — E785 Hyperlipidemia, unspecified: Secondary | ICD-10-CM | POA: Diagnosis present

## 2013-04-16 LAB — BASIC METABOLIC PANEL
BUN: 13 mg/dL (ref 6–23)
CALCIUM: 7.9 mg/dL — AB (ref 8.4–10.5)
CO2: 27 mEq/L (ref 19–32)
Chloride: 97 mEq/L (ref 96–112)
Creatinine, Ser: 1.18 mg/dL — ABNORMAL HIGH (ref 0.50–1.10)
GFR calc Af Amer: 56 mL/min — ABNORMAL LOW (ref >90)
GFR calc non Af Amer: 48 mL/min — ABNORMAL LOW (ref >90)
GLUCOSE: 94 mg/dL (ref 70–99)
Potassium: 4.1 mEq/L (ref 3.7–5.3)
SODIUM: 134 meq/L — AB (ref 137–147)

## 2013-04-16 LAB — CBC
HCT: 23.5 % — ABNORMAL LOW (ref 36.0–46.0)
HEMOGLOBIN: 8 g/dL — AB (ref 12.0–15.0)
MCH: 32.9 pg (ref 26.0–34.0)
MCHC: 34 g/dL (ref 30.0–36.0)
MCV: 96.7 fL (ref 78.0–100.0)
Platelets: 242 10*3/uL (ref 150–400)
RBC: 2.43 MIL/uL — ABNORMAL LOW (ref 3.87–5.11)
RDW: 13.3 % (ref 11.5–15.5)
WBC: 5.7 10*3/uL (ref 4.0–10.5)

## 2013-04-16 LAB — VITAMIN D 1,25 DIHYDROXY
VITAMIN D 1, 25 (OH) TOTAL: 11 pg/mL — AB (ref 18–72)
Vitamin D3 1, 25 (OH)2: 11 pg/mL

## 2013-04-16 MED ORDER — ASPIRIN 325 MG PO TABS: 325.0000 mg | ORAL_TABLET | Freq: Every day | ORAL | Status: AC

## 2013-04-16 MED ORDER — DSS 100 MG PO CAPS: 100.0000 mg | ORAL_CAPSULE | Freq: Two times a day (BID) | ORAL | Status: AC

## 2013-04-16 MED ORDER — OXYCODONE HCL 5 MG PO TABS: 5.0000 mg | ORAL_TABLET | ORAL | Status: AC | PRN

## 2013-04-16 MED ORDER — TRAMADOL HCL 50 MG PO TABS: 50.0000 mg | ORAL_TABLET | Freq: Four times a day (QID) | ORAL | Status: DC | PRN

## 2013-04-16 MED ORDER — POLYETHYLENE GLYCOL 3350 17 G PO PACK: 17.0000 g | PACK | Freq: Every day | ORAL | Status: AC

## 2013-04-16 MED ORDER — METHOCARBAMOL 500 MG PO TABS: 500.0000 mg | ORAL_TABLET | Freq: Four times a day (QID) | ORAL | Status: AC | PRN

## 2013-04-16 MED ORDER — FERROUS SULFATE 325 (65 FE) MG PO TABS: 325.0000 mg | ORAL_TABLET | Freq: Three times a day (TID) | ORAL | Status: AC

## 2013-04-16 NOTE — Progress Notes (Signed)
Occupational Therapy Treatment and Discharge Patient Details Name: Fayne Norrieatricia A Mcbrien MRN: [id number] DOB: October 18, 1950 Today's Date: 04/16/2013 Time: [id number] OT Time Calculation (min): 44 min  OT Assessment / Plan / Recommendation  History of present illness Pt s/p mechanical fall who suffered R humerus fx. Pt underwent ORIF to R humeral fx 1/5.   OT comments  This 63 yo making progress and [facility] Education with family and pt completed today.  Follow Up Recommendations  Home health OT             Frequency Min 3X/week   Progress towards OT Goals Progress towards OT goals: Progressing toward goals  Plan Discharge plan needs to be updated    Precautions / Restrictions Precautions Precautions: Fall Precaution Comments: humerus fracture Required Braces or Orthoses: Sling Restrictions Weight Bearing Restrictions: Yes RUE Weight Bearing: Non weight bearing Other Position/Activity Restrictions: per MD order pt can WBAT to mobilize; no active Rt shoulder abduction        ADL  Equipment Used: Gait belt;Rolling walker Transfers/Ambulation Related to ADLs: Min A for all with from/to bed with VC's to keep weight forward to make sit<>stand easier. family in room to see how she did  ADL Comments: Went over with pt and family to she needs to do BADLs with washing under her RUE and getting RUE dressed (by leaning to her right to let gravity A her arm from coming away from her body), showed family how they are to A pt with RUE abduction (PROM only) no more than 60 degrees. Had pt perform 10 reps of AROM elbow flexion/extension supine and AAROM for shoulder flexion to 90 degrees supine. Had pt do pendulums sitting with RUE between LEs for forward/back and side to side; cannot do the circular ones in seated or in standing.  Gave pt additional handouts with exercise pictures on them and our OT general post op shoulder handout (which also applies to how this pt is do her  BADLs). Family was also able to see how pt ambulated with RW and how they needed to be with her when she was up on her feet.     OT Goals(current goals can now be found in the care plan section)    Visit Information  Last OT Received On: 04/16/13 Assistance Needed: +1 History of Present Illness: Pt s/p mechanical fall who suffered R humerus fx. Pt underwent ORIF to R humeral fx 1/5.          Cognition  Cognition Arousal/Alertness: Awake/alert Behavior During Therapy: WFL for tasks assessed/performed Overall Cognitive Status: Within Functional Limits for tasks assessed    Mobility  Bed Mobility Overal bed mobility: Needs Assistance Bed Mobility: Sit to Supine Sit to supine: Min assist Transfers Overall transfer level: Needs assistance Equipment used: Rolling walker (2 wheeled) Transfers: Sit to/from Stand Sit to Stand: Min assist Stand pivot transfers: Min assist General transfer comment: VCs to keep weight forward    Exercises  Other Exercises Other Exercises: See ADL section      End of Session OT - End of Session Equipment Utilized During Treatment: Gait belt;Rolling walker Activity Tolerance: Patient tolerated treatment well Patient left: with family/visitor present (sitting EOB) Nurse Communication:  (Pt ready to go once she gets her RW)       Evette GeorgesLeonard, Creedence Heiss Eva [phone] 04/16/2013, 12:23 PM

## 2013-04-16 NOTE — Progress Notes (Signed)
Pt discharged to home. D/c instructions given. No questions verbalized. Vitals stable. 

## 2013-04-16 NOTE — Discharge Instructions (Signed)
Orthopaedic Trauma Service Discharge Instructions   General Discharge Instructions  WEIGHT BEARING STATUS: can weight bear through elbow to help mobilize. No lifting with R arm  RANGE OF MOTION/ACTIVITY: R shoulder pendulums only.  Active and passive motion of elbow, forearm, wrist and hand   Diet: as you were eating previously.  Can use over the counter stool softeners and bowel preparations, such as Miralax, to help with bowel movements.  Narcotics can be constipating.  Be sure to drink plenty of fluids  STOP SMOKING OR USING NICOTINE PRODUCTS!!!!  As discussed nicotine severely impairs your body's ability to heal surgical and traumatic wounds but also impairs bone healing.  Wounds and bone heal by forming microscopic blood vessels (angiogenesis) and nicotine is a vasoconstrictor (essentially, shrinks blood vessels).  Therefore, if vasoconstriction occurs to these microscopic blood vessels they essentially disappear and are unable to deliver necessary nutrients to the healing tissue.  This is one modifiable factor that you can do to dramatically increase your chances of healing your injury.    (This means no smoking, no nicotine gum, patches, etc)  DO NOT USE NONSTEROIDAL ANTI-INFLAMMATORY DRUGS (NSAID'S)  Using products such as Advil (ibuprofen), Aleve (naproxen), Motrin (ibuprofen) for additional pain control during fracture healing can delay and/or prevent the healing response.  If you would like to take over the counter (OTC) medication, Tylenol (acetaminophen) is ok.  However, some narcotic medications that are given for pain control contain acetaminophen as well. Therefore, you should not exceed more than 4000 mg of tylenol in a day if you do not have liver disease.  Also note that there are may OTC medicines, such as cold medicines and allergy medicines that my contain tylenol as well.  If you have any questions about medications and/or interactions please ask your doctor/PA or your  pharmacist.   PAIN MEDICATION USE AND EXPECTATIONS  You have likely been given narcotic medications to help control your pain.  After a traumatic event that results in an fracture (broken bone) with or without surgery, it is ok to use narcotic pain medications to help control one's pain.  We understand that everyone responds to pain differently and each individual patient will be evaluated on a regular basis for the continued need for narcotic medications. Ideally, narcotic medication use should last no more than 6-8 weeks (coinciding with fracture healing).   As a patient it is your responsibility as well to monitor narcotic medication use and report the amount and frequency you use these medications when you come to your office visit.   We would also advise that if you are using narcotic medications, you should take a dose prior to therapy to maximize you participation.  IF YOU ARE ON NARCOTIC MEDICATIONS IT IS NOT PERMISSIBLE TO OPERATE A MOTOR VEHICLE (MOTORCYCLE/CAR/TRUCK/MOPED) OR HEAVY MACHINERY DO NOT MIX NARCOTICS WITH OTHER CNS (CENTRAL NERVOUS SYSTEM) DEPRESSANTS SUCH AS ALCOHOL       ICE AND ELEVATE INJURED/OPERATIVE EXTREMITY  Using ice and elevating the injured extremity above your heart can help with swelling and pain control.  Icing in a pulsatile fashion, such as 20 minutes on and 20 minutes off, can be followed.    Do not place ice directly on skin. Make sure there is a barrier between to skin and the ice pack.    Using frozen items such as frozen peas works well as the conform nicely to the are that needs to be iced.  USE AN ACE WRAP OR TED HOSE FOR SWELLING  CONTROL  In addition to icing and elevation, Ace wraps or TED hose are used to help limit and resolve swelling.  It is recommended to use Ace wraps or TED hose until you are informed to stop.    When using Ace Wraps start the wrapping distally (farthest away from the body) and wrap proximally (closer to the  body)   Example: If you had surgery on your leg or thing and you do not have a splint on, start the ace wrap at the toes and work your way up to the thigh        If you had surgery on your upper extremity and do not have a splint on, start the ace wrap at your fingers and work your way up to the upper arm  IF YOU ARE IN A SPLINT OR CAST DO NOT [place]   If your splint gets wet for any reason please contact the office immediately. You may shower in your splint or cast as long as you keep it dry.  This can be done by wrapping in a cast cover or garbage back (or similar)  Do Not stick any thing down your splint or cast such as pencils, money, or hangers to try and scratch yourself with.  If you feel itchy take benadryl as prescribed on the bottle for itching  IF YOU ARE IN A CAM BOOT (BLACK BOOT)  You may remove boot periodically. Perform daily dressing changes as noted below.  Wash the liner of the boot regularly and wear a sock when wearing the boot. It is recommended that you sleep in the boot until told otherwise  CALL THE OFFICE WITH ANY QUESTIONS OR CONCERTS: [phone]     Discharge Pin Site Instructions  Dress pins daily with Kerlix roll starting on POD 2. Wrap the Kerlix so that it tamps the skin down around the pin-skin interface to prevent/limit motion of the skin relative to the pin.  (Pin-skin motion is the primary cause of pain and infection related to external fixator pin sites).  Remove any crust or coagulum that may obstruct drainage with a saline moistened gauze or soap and water.  After POD 3, if there is no discernable drainage on the pin site dressing, the interval for change can by increased to every other day.  You may shower with the fixator, cleaning all pin sites gently with soap and water.  If you have a surgical wound this needs to be completely dry and without drainage before showering.  The extremity can be lifted by the fixator to facilitate  wound care and transfers.  Notify the office/Doctor if you experience increasing drainage, redness, or pain from a pin site, or if you notice purulent (thick, snot-like) drainage.  Discharge Wound Care Instructions  Do NOT apply any ointments, solutions or lotions to pin sites or surgical wounds.  These prevent needed drainage and even though solutions like hydrogen peroxide kill bacteria, they also damage cells lining the pin sites that help fight infection.  Applying lotions or ointments can keep the wounds moist and can cause them to breakdown and open up as well. This can increase the risk for infection. When in doubt call the office.  Surgical incisions should be dressed daily.  If any drainage is noted, use one layer of adaptic, then gauze, Kerlix, and an ace wrap.  Once the incision is completely dry and without drainage, it may be left open to air out.  Showering may  begin 36-48 hours later.  Cleaning gently with soap and water.  Traumatic wounds should be dressed daily as well.    One layer of adaptic, gauze, Kerlix, then ace wrap.  The adaptic can be discontinued once the draining has ceased    If you have a wet to dry dressing: wet the gauze with saline the squeeze as much saline out so the gauze is moist (not soaking wet), place moistened gauze over wound, then place a dry gauze over the moist one, followed by Kerlix wrap, then ace wrap.

## 2013-04-16 NOTE — Progress Notes (Signed)
04/16/13 Patient stating that she does not have a rolling walker. Contacted Darian with [facility], rolling walker to be delivered to patient's room prior to discharge. Jacquelynn CreeMary Derelle Cockrell RN, BSN, CCM    04/15/13 1:20pm Vance PeperSusan Brady, RN BSN Case Manager  spoke with patient concerning Home Health needs. She selected [facility], CM had to explain that BC/BS is out of network with [facility]. [facility]. [facility]. Pateint states she has rolling walker and 3in1. Patient may require SNF.

## 2013-04-16 NOTE — Progress Notes (Signed)
[facility] Progress Note  Subjective  Doing great Ready to go home No new complaints  Review of Systems  Constitutional: Negative for fever and chills.  Eyes: Negative for blurred vision.  Respiratory: Negative for cough and wheezing.   Cardiovascular: Negative for chest pain and palpitations.  Gastrointestinal: Negative for nausea, vomiting and abdominal pain.  Genitourinary: Negative for dysuria.  Neurological: Negative for tingling, sensory change and headaches.    Objective   BP 114/67  Pulse 105  Temp(Src) 98 F (36.7 C) (Oral)  Resp 18  Ht 5\' 6"  (1.676 m)  Wt 76.839 kg (169 lb 6.4 oz)  BMI 27.35 kg/m2  SpO2 93%  Intake/Output     01/08 0701 - 01/09 0700 01/09 0701 - 01/10 0700   P.O. 760    I.V. (mL/kg) 200 (2.6)    Total Intake(mL/kg) 960 (12.5)    Urine (mL/kg/hr) 1200 (0.7)    Total Output 1200     Net -240          Urine Occurrence 4 x      Labs  Results for SIGNA, CHEEK (MRN [id number]) as of 04/16/2013 09:17  Ref. Range 04/16/2013 05:42  Sodium Latest Range: 137-147 mEq/L 134 (L)  Potassium Latest Range: 3.7-5.3 mEq/L 4.1  Chloride Latest Range: 96-112 mEq/L 97  CO2 Latest Range: 19-32 mEq/L 27  BUN Latest Range: 6-23 mg/dL 13  Creatinine Latest Range: 0.50-1.10 mg/dL 4.09 (H)  Calcium Latest Range: 8.4-10.5 mg/dL 7.9 (L)  GFR calc non Af Amer Latest Range: >90 mL/min 48 (L)  GFR calc Af Amer Latest Range: >90 mL/min 56 (L)  Glucose Latest Range: 70-99 mg/dL 94  WBC Latest Range: 8.1-19.1 K/uL 5.7  RBC Latest Range: 3.87-5.11 MIL/uL 2.43 (L)  Hemoglobin Latest Range: 12.0-15.0 g/dL 8.0 (L)  HCT Latest Range: 36.0-46.0 % 23.5 (L)  MCV Latest Range: 78.0-100.0 fL 96.7  MCH Latest Range: 26.0-34.0 pg 32.9  MCHC Latest Range: 30.0-36.0 g/dL 47.8  RDW Latest Range: 11.5-15.5 % 13.3  Platelets Latest Range: 150-400 K/uL 242    Exam  Gen: awake and alert, resting comfortably in bed Lungs: Clear anteriorly Cardiac: Regular  rate and rhythm S1 and S2 Abd: + Bowel sounds, nontender nondistended Ext:       Right Upper Extremity   Swelling controlled             Radial, ulnar, median nv sens intact             Axillary nv sensation intact             R/U/M/AIN/PIN motor intact             Ax motor grossly intact             Ext warm             + radial pulse             Sling and ace stable             Good active motion with hand   Incision is stable, no signs of infection, scant serosanguineous drainage   Assessment and Plan    POD/HD#: 74    63 y/o RHD female s/p fall with R proximal 1/3 humeral shaft fx s/p ORIF  1. Fall 2. R proximal 1/3 humeral shaft fracture s/p ORIF             Can WB thru R upper extremity as needed for mobilization  Unrestricted ROM R wrist, forearm and elbow             FF of shoulder only and passive abduction               No active abduction of R shoulder             Shoulder pendulums ok               Ice prn               Sling for comfort             PT/OT               Dressing changes as needed  3. Pain management:            continue with current regimen  Pt does not want ultram as it makes her feel "foggy"  4. ABL anemia/Hemodynamics           stable  Asymptomatic   5. DVT/PE prophylaxis:           ASA 325 mg po daily   6. ID:               Completed periop abx                          Pt with chronic Hep C   7. Metabolic Bone Disease:             Vitamin D level looks good             Vitamin d and calcium supplementation per age recommendations   8. Activity:             OOB with therapy               As per #2  9. FEN/Foley/Lines:             Advance diet               Hyponatremia- improved            10. Impediments to fracture healing:             Chronic disease             Nicotine dependence  11. Nicotine dependence             No nicotine supplements             Pt quit a few days ago             Continue to  support  12. Advanced directives            pt DNR  13. H/o ARF             Cr back to baseline                 14. General medical issues             Home meds             Watch BP             COPD meds              15. Dispo:             PT/OT               dc home  Follow up with  ortho in 10 days    Mearl LatinKeith W. Megen Madewell, PA-C [facility] [phone] (P) 04/16/2013 9:15 AM

## 2013-04-16 NOTE — Progress Notes (Signed)
04/16/13 Patient stating that she does not have a rolling walker. Contacted Darian with [facility], rolling walker to be delivered to patient's room prior to discharge. Jamesina Gaugh RN, BSN, CCM    04/15/13 1:20pm Susan Brady, RN BSN Case Manager  spoke with patient concerning Home Health needs. She selected [facility], CM had to explain that BC/BS is out of network with [facility]. [facility] selected. CM contacted Madigan Rosensteel Hickling,RN [facility] liasion with [facility]. Pateint states she has rolling walker and 3in1. Patient may require SNF.   

## 2013-04-16 NOTE — Progress Notes (Signed)
PT Cancellation Note  Patient Details Name: [name] Norrieatricia A Granillo MRN: [id number] DOB: 02/20/1951   Cancelled Treatment:     Pt politely declining therapy at this time.  She states OT reviewed & educated caregiver on how to assist with mobility.  Pt states she feels comfortable with everything & is ready to d/c home.     Lara MulchCooper, Efosa Treichler Lynn 04/16/2013, 11:12 AM  Verdell FaceKelly Brystal Kildow, PTA [phone] 04/16/2013

## 2013-04-16 NOTE — Discharge Summary (Signed)
Orthopaedic Trauma Service (OTS)  Patient ID: [name] [name] MRN: [id number] DOB/AGE: [birth year] 63 y.o.  Admit date: 04/12/2013 Discharge date: 04/16/2013  Admission Diagnoses: R humeral shaft fracture HTN Hyperlipidemia COPD Depression  Anxiety  Hx of hep c  Discharge Diagnoses:  Principal Problem:   Humeral shaft fracture Active Problems:   Hypertension   Hyperlipidemia   COPD (chronic obstructive pulmonary disease)   Hepatitis   Depression   Anxiety   Procedures Performed: 04/12/2013- Dr Carola Frost  1. Open reduction and internal fixation of right proximal humerus     fracture. 2. Infuse allografting.   04/13/2013  Transfused 1 unit of PRBC's    Discharged Condition: good  Hospital Course:   Patient is group pleasant 63 year old right-hand-dominant white female who sustained a ground-level fall on 03/14/2013. She sustained a comminuted right proximal third humeral shaft fracture. Initially seen at [facility] and admitted to the hospitalist service there for acute renal failure and acute respiratory failure. Ultimately discharged and referred to the orthopedic trauma specialists for definitive management. Patient was seen and evaluated in our office on 04/12/2012 and was then taken to the operating room later that afternoon where ORIF of her right proximal humerus was performed. Patient tolerated the procedure well. That given her COPD and recent respiratory failure as well as acute renal failure we did have her transferred to the step down unit for close observation following surgery. Patient's hospital stay was relatively uncomplicated on postoperative day #1 there was a pretty precipitous drop in her H&H as well as elevation in her creatinine. We did transfuse her with one unit of packed red cells which she did tolerate well. On postoperative day #2 patient was much improved pain was improved as well. And she was feeling better. We had her transferred to the  orthopedic floor for more dedicated therapies. The patient continued to progress very well over the next several days. Ultimately on postoperative day #4 she was deemed to be stable for discharge to home with home therapies. Patient did have a Foley catheter during her hospitalization and this was discontinued on postoperative day #2. We slowly advanced her diet and she was tolerating a regular diet by postoperative day #4 as well. No other issues were noted of concern prior to discharge. Patient discharged in stable condition. Voiding on her own, pain is well-controlled with oral pain medication and tolerating regular diet. Patient was also started on aspirin for clot prevention. We did for cough on Lovenox and aspirin postoperatively as we did encounter a pretty significant oozing as the patient was on aspirin for 4 weeks leading up to surgery. We did not want to precipitate any further drop in her H&H. Again once her H&H was stabilized we did restart her aspirin.    Consults: None  Significant Diagnostic Studies: labs: Results for [name], [name] (MRN [id number]) as of 04/16/2013 08:59  Ref. Range 04/16/2013 05:42  Sodium Latest Range: 137-147 mEq/L 134 (L)  Potassium Latest Range: 3.7-5.3 mEq/L 4.1  Chloride Latest Range: 96-112 mEq/L 97  CO2 Latest Range: 19-32 mEq/L 27  BUN Latest Range: 6-23 mg/dL 13  Creatinine Latest Range: 0.50-1.10 mg/dL 7.82 (H)  Calcium Latest Range: 8.4-10.5 mg/dL 7.9 (L)  GFR calc non Af Amer Latest Range: >90 mL/min 48 (L)  GFR calc Af Amer Latest Range: >90 mL/min 56 (L)  Glucose Latest Range: 70-99 mg/dL 94  WBC Latest Range: 9.5-62.1 K/uL 5.7  RBC Latest Range: 3.87-5.11 MIL/uL 2.43 (L)  Hemoglobin Latest Range: 12.0-15.0 g/dL 8.0 (L)  HCT Latest Range: 36.0-46.0 % 23.5 (L)  MCV Latest Range: 78.0-100.0 fL 96.7  MCH Latest Range: 26.0-34.0 pg 32.9  MCHC Latest Range: 30.0-36.0 g/dL 16.1  RDW Latest Range: 11.5-15.5 % 13.3  Platelets Latest Range: 150-400  K/uL 242    Treatments: IV hydration, antibiotics: Ancef, analgesia: Dilaudid and oxycodone, ultram, anticoagulation: ASA, therapies: PT, OT and RN and surgery: as above   Discharge Exam:      Orthopaedic Trauma Service Progress Note  Subjective  Doing great Ready to go home No new complaints  Review of Systems  Constitutional: Negative for fever and chills.  Eyes: Negative for blurred vision.  Respiratory: Negative for cough and wheezing.   Cardiovascular: Negative for chest pain and palpitations.  Gastrointestinal: Negative for nausea, vomiting and abdominal pain.  Genitourinary: Negative for dysuria.  Neurological: Negative for tingling, sensory change and headaches.    Objective   BP 114/67  Pulse 105  Temp(Src) 98 F (36.7 C) (Oral)  Resp 18  Ht 5\' 6"  (1.676 m)  Wt 76.839 kg (169 lb 6.4 oz)  BMI 27.35 kg/m2  SpO2 93%  Intake/Output     01/08 0701 - 01/09 0700 01/09 0701 - 01/10 0700    P.O. 760     I.V. (mL/kg) 200 (2.6)     Total Intake(mL/kg) 960 (12.5)     Urine (mL/kg/hr) 1200 (0.7)     Total Output 1200      Net -240            Urine Occurrence 4 x       Labs  Results for [name], [name] (MRN [id number]) as of 04/16/2013 09:17   Ref. Range  04/16/2013 05:42   Sodium  Latest Range: 137-147 mEq/L  134 (L)   Potassium  Latest Range: 3.7-5.3 mEq/L  4.1   Chloride  Latest Range: 96-112 mEq/L  97   CO2  Latest Range: 19-32 mEq/L  27   BUN  Latest Range: 6-23 mg/dL  13   Creatinine  Latest Range: 0.50-1.10 mg/dL  8.11 (H)   Calcium  Latest Range: 8.4-10.5 mg/dL  7.9 (L)   GFR calc non Af Amer  Latest Range: >90 mL/min  48 (L)   GFR calc Af Amer  Latest Range: >90 mL/min  56 (L)   Glucose  Latest Range: 70-99 mg/dL  94   WBC  Latest Range: 4.0-10.5 K/uL  5.7   RBC  Latest Range: 3.87-5.11 MIL/uL  2.43 (L)   Hemoglobin  Latest Range: 12.0-15.0 g/dL  8.0 (L)   HCT  Latest Range: 36.0-46.0 %  23.5 (L)   MCV  Latest Range: 78.0-100.0 fL  96.7   MCH   Latest Range: 26.0-34.0 pg  32.9   MCHC  Latest Range: 30.0-36.0 g/dL  91.4   RDW  Latest Range: 11.5-15.5 %  13.3   Platelets  Latest Range: 150-400 K/uL  242     Exam  Gen: awake and alert, resting comfortably in bed Lungs: Clear anteriorly Cardiac: Regular rate and rhythm S1 and S2 Abd: + Bowel sounds, nontender nondistended Ext:        Right Upper Extremity               Swelling controlled             Radial, ulnar, median nv sens intact             Axillary nv sensation intact  R/U/M/AIN/PIN motor intact             Ax motor grossly intact             Ext warm             + radial pulse             Sling and ace stable             Good active motion with hand              Incision is stable, no signs of infection, scant serosanguineous drainage   Assessment and Plan    POD/HD#: 56    63 y/o RHD female s/p fall with R proximal 1/3 humeral shaft fx s/p ORIF  1. Fall 2. R proximal 1/3 humeral shaft fracture s/p ORIF             Can WB thru R upper extremity as needed for mobilization               Unrestricted ROM R wrist, forearm and elbow             FF of shoulder only and passive abduction               No active abduction of R shoulder             Shoulder pendulums ok               Ice prn               Sling for comfort             PT/OT               Dressing changes as needed  3. Pain management:            continue with current regimen             Pt does not want ultram as it makes her feel "foggy"  4. ABL anemia/Hemodynamics           stable             Asymptomatic   5. DVT/PE prophylaxis:           ASA 325 mg po daily   6. ID:               Completed periop abx                          Pt with chronic Hep C   7. Metabolic Bone Disease:             Vitamin D level looks good             Vitamin d and calcium supplementation per age recommendations   8. Activity:             OOB with therapy               As per #2  9.  FEN/Foley/Lines:             Advance diet               Hyponatremia- improved             10. Impediments to fracture healing:             Chronic disease  Nicotine dependence  11. Nicotine dependence             No nicotine supplements             Pt quit a few days ago             Continue to support  12. Advanced directives            pt DNR  13. H/o ARF             Cr back to baseline                 14. General medical issues             Home meds             Watch BP             COPD meds              15. Dispo:             PT/OT               dc home             Follow up with ortho in 10 days    Mearl LatinKeith W. Tatia Petrucci, PA-C [facility] [phone] (P) 04/16/2013 9:15 AM   Disposition: Home with home health PT and OT      Discharge Orders   Future Orders Complete By Expires   Call MD / Call 911  As directed    Comments:     If you experience chest pain or shortness of breath, CALL 911 and be transported to the hospital emergency room.  If you develope a fever above 101 F, pus (white drainage) or increased drainage or redness at the wound, or calf pain, call your surgeon's office.   Constipation Prevention  As directed    Comments:     Drink plenty of fluids.  Prune juice may be helpful.  You may use a stool softener, such as Colace (over the counter) 100 mg twice a day.  Use MiraLax (over the counter) for constipation as needed.   Diet - low sodium heart healthy  As directed    Discharge instructions  As directed    Comments:     Orthopaedic Trauma Service Discharge Instructions   General Discharge Instructions  WEIGHT BEARING STATUS: can weight bear through elbow to help mobilize. No lifting with R arm  RANGE OF MOTION/ACTIVITY: R shoulder pendulums only.  Active and passive motion of elbow, forearm, wrist and hand   Diet: as you were eating previously.  Can use over the counter stool softeners and bowel preparations, such as  Miralax, to help with bowel movements.  Narcotics can be constipating.  Be sure to drink plenty of fluids  STOP SMOKING OR USING NICOTINE PRODUCTS!!!!  As discussed nicotine severely impairs your body's ability to heal surgical and traumatic wounds but also impairs bone healing.  Wounds and bone heal by forming microscopic blood vessels (angiogenesis) and nicotine is a vasoconstrictor (essentially, shrinks blood vessels).  Therefore, if vasoconstriction occurs to these microscopic blood vessels they essentially disappear and are unable to deliver necessary nutrients to the healing tissue.  This is one modifiable factor that you can do to dramatically increase your chances of healing your injury.    (This means no smoking, no nicotine gum, patches, etc)  DO NOT USE NONSTEROIDAL ANTI-INFLAMMATORY DRUGS (NSAID'S)  Using products such  as Advil (ibuprofen), Aleve (naproxen), Motrin (ibuprofen) for additional pain control during fracture healing can delay and/or prevent the healing response.  If you would like to take over the counter (OTC) medication, Tylenol (acetaminophen) is ok.  However, some narcotic medications that are given for pain control contain acetaminophen as well. Therefore, you should not exceed more than 4000 mg of tylenol in a day if you do not have liver disease.  Also note that there are may OTC medicines, such as cold medicines and allergy medicines that my contain tylenol as well.  If you have any questions about medications and/or interactions please ask your doctor/PA or your pharmacist.   PAIN MEDICATION USE AND EXPECTATIONS  You have likely been given narcotic medications to help control your pain.  After a traumatic event that results in an fracture (broken bone) with or without surgery, it is ok to use narcotic pain medications to help control one's pain.  We understand that everyone responds to pain differently and each individual patient will be evaluated on a regular basis for the  continued need for narcotic medications. Ideally, narcotic medication use should last no more than 6-8 weeks (coinciding with fracture healing).   As a patient it is your responsibility as well to monitor narcotic medication use and report the amount and frequency you use these medications when you come to your office visit.   We would also advise that if you are using narcotic medications, you should take a dose prior to therapy to maximize you participation.  IF YOU ARE ON NARCOTIC MEDICATIONS IT IS NOT PERMISSIBLE TO OPERATE A MOTOR VEHICLE (MOTORCYCLE/CAR/TRUCK/MOPED) OR HEAVY MACHINERY DO NOT MIX NARCOTICS WITH OTHER CNS (CENTRAL NERVOUS SYSTEM) DEPRESSANTS SUCH AS ALCOHOL       ICE AND ELEVATE INJURED/OPERATIVE EXTREMITY  Using ice and elevating the injured extremity above your heart can help with swelling and pain control.  Icing in a pulsatile fashion, such as 20 minutes on and 20 minutes off, can be followed.    Do not place ice directly on skin. Make sure there is a barrier between to skin and the ice pack.    Using frozen items such as frozen peas works well as the conform nicely to the are that needs to be iced.  USE AN ACE WRAP OR TED HOSE FOR SWELLING CONTROL  In addition to icing and elevation, Ace wraps or TED hose are used to help limit and resolve swelling.  It is recommended to use Ace wraps or TED hose until you are informed to stop.    When using Ace Wraps start the wrapping distally (farthest away from the body) and wrap proximally (closer to the body)   Example: If you had surgery on your leg or thing and you do not have a splint on, start the ace wrap at the toes and work your way up to the thigh        If you had surgery on your upper extremity and do not have a splint on, start the ace wrap at your fingers and work your way up to the upper arm  IF YOU ARE IN A SPLINT OR CAST DO NOT REMOVE IT FOR ANY REASON   If your splint gets wet for any reason please contact the  office immediately. You may shower in your splint or cast as long as you keep it dry.  This can be done by wrapping in a cast cover or garbage back (or similar)  Do Not  stick any thing down your splint or cast such as pencils, money, or hangers to try and scratch yourself with.  If you feel itchy take benadryl as prescribed on the bottle for itching  IF YOU ARE IN A CAM BOOT (BLACK BOOT)  You may remove boot periodically. Perform daily dressing changes as noted below.  Wash the liner of the boot regularly and wear a sock when wearing the boot. It is recommended that you sleep in the boot until told otherwise  CALL THE OFFICE WITH ANY QUESTIONS OR CONCERTS: [phone]     Discharge Pin Site Instructions  Dress pins daily with Kerlix roll starting on POD 2. Wrap the Kerlix so that it tamps the skin down around the pin-skin interface to prevent/limit motion of the skin relative to the pin.  (Pin-skin motion is the primary cause of pain and infection related to external fixator pin sites).  Remove any crust or coagulum that may obstruct drainage with a saline moistened gauze or soap and water.  After POD 3, if there is no discernable drainage on the pin site dressing, the interval for change can by increased to every other day.  You may shower with the fixator, cleaning all pin sites gently with soap and water.  If you have a surgical wound this needs to be completely dry and without drainage before showering.  The extremity can be lifted by the fixator to facilitate wound care and transfers.  Notify the office/Doctor if you experience increasing drainage, redness, or pain from a pin site, or if you notice purulent (thick, snot-like) drainage.  Discharge Wound Care Instructions  Do NOT apply any ointments, solutions or lotions to pin sites or surgical wounds.  These prevent needed drainage and even though solutions like hydrogen peroxide kill bacteria, they also damage cells lining the pin  sites that help fight infection.  Applying lotions or ointments can keep the wounds moist and can cause them to breakdown and open up as well. This can increase the risk for infection. When in doubt call the office.  Surgical incisions should be dressed daily.  If any drainage is noted, use one layer of adaptic, then gauze, Kerlix, and an ace wrap.  Once the incision is completely dry and without drainage, it may be left open to air out.  Showering may begin 36-48 hours later.  Cleaning gently with soap and water.  Traumatic wounds should be dressed daily as well.    One layer of adaptic, gauze, Kerlix, then ace wrap.  The adaptic can be discontinued once the draining has ceased    If you have a wet to dry dressing: wet the gauze with saline the squeeze as much saline out so the gauze is moist (not soaking wet), place moistened gauze over wound, then place a dry gauze over the moist one, followed by Kerlix wrap, then ace wrap.   Driving restrictions  As directed    Comments:     No driving   Increase activity slowly as tolerated  As directed    Lifting restrictions  As directed    Comments:     No lifting       Medication List    STOP taking these medications       LOSARTAN POTASSIUM PO      TAKE these medications       albuterol-ipratropium 18-103 MCG/ACT inhaler  Commonly known as:  COMBIVENT  Inhale 2 puffs into the lungs every 4 (four) hours as needed  for wheezing or shortness of breath.     ALPRAZolam 0.5 MG tablet  Commonly known as:  XANAX  Take 0.5 mg by mouth 2 (two) times daily as needed for anxiety.     aspirin 325 MG tablet  Take 1 tablet (325 mg total) by mouth daily.     CALCIUM PO  Take 1 tablet by mouth daily.     DSS 100 MG Caps  Take 100 mg by mouth 2 (two) times daily.     ferrous sulfate 325 (65 FE) MG tablet  Take 1 tablet (325 mg total) by mouth 3 (three) times daily after meals.     FLUoxetine 20 MG capsule  Commonly known as:  PROZAC  Take  20 mg by mouth at bedtime.     methocarbamol 500 MG tablet  Commonly known as:  ROBAXIN  Take 1-2 tablets (500-1,000 mg total) by mouth every 6 (six) hours as needed for muscle spasms.     oxyCODONE 5 MG immediate release tablet  Commonly known as:  Oxy IR/ROXICODONE  Take 1-3 tablets (5-15 mg total) by mouth every 4 (four) hours as needed for moderate pain, severe pain or breakthrough pain.     polyethylene glycol packet  Commonly known as:  MIRALAX / GLYCOLAX  Take 17 g by mouth daily.     traMADol 50 MG tablet  Commonly known as:  ULTRAM  Take 1 tablet (50 mg total) by mouth every 6 (six) hours as needed for moderate pain or severe pain.     VITAMIN D PO  Take 1 tablet by mouth daily.       Follow-up Information   Follow up with HANDY,MICHAEL H, MD. Schedule an appointment as soon as possible for a visit in 2 weeks. (call for date and time )    Specialty:  Orthopedic Surgery   Contact information:   [address] [place] [address] [phone]       Discharge Instructions and Plan: Pt has sustained a severe injury to her Right upper extremity.  We were able to achieve a technically successful ORIF of the R humeral shaft     Pt can be WBAT through R elbow to facilitate mobilization but no lifting with R arm and no bearing weight through R arm to use walker  Gentle pendulums and PROM of R shoulder only.  NO ACTIVE abduction to R shoulder AROM R elbow, forearm, wrist and hand Dressing changes as needed, see wound care instructions for details Ice prn  Sling for comfort and when up out of bed Ace wrap to R arm, remove daily to allow skin to breathe  Follow up in 10-14 days, call for appointment Xrays and removal of sutures at that time Will initiate AAROM of the R shoulder around the 4-6 week mark, AROM around the 6-8 week mark and resistance activities for the R shoulder around the 8 week mark Please call the office with questions.    Signed:  Mearl Latin, PA-C [facility] [phone] (P) 04/16/2013, 8:59 AM

## 2015-01-14 IMAGING — RF DG C-ARM 61-120 MIN
1 series · 9 of 9 positions shown · non-contrast
Comparison: Plain films 03/16/2013.

CLINICAL DATA: Fracture fixation.

EXAM:
RIGHT HUMERUS - 2+ VIEW; DG C-ARM 61-120 MIN

[Series 1: run · 9 of 9 slices shown]
[im 1/9]
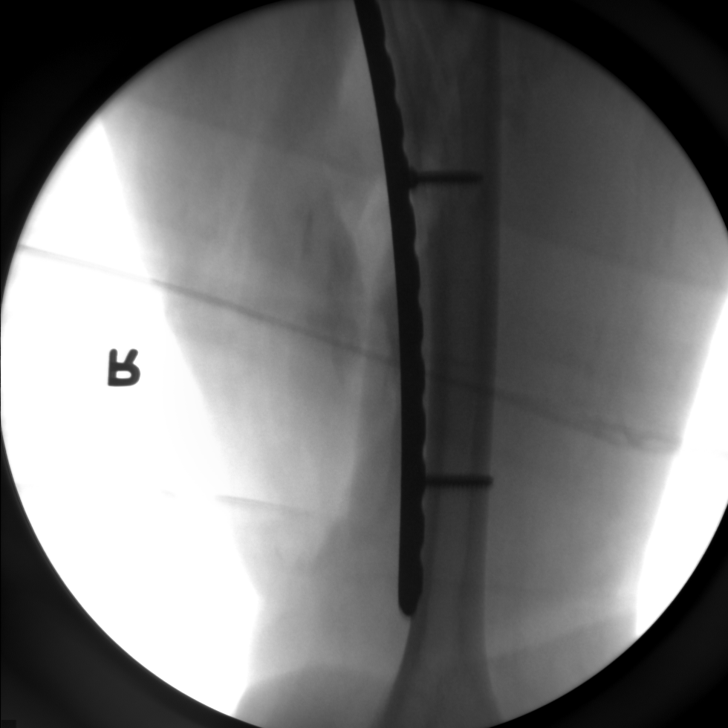
[im 2/9]
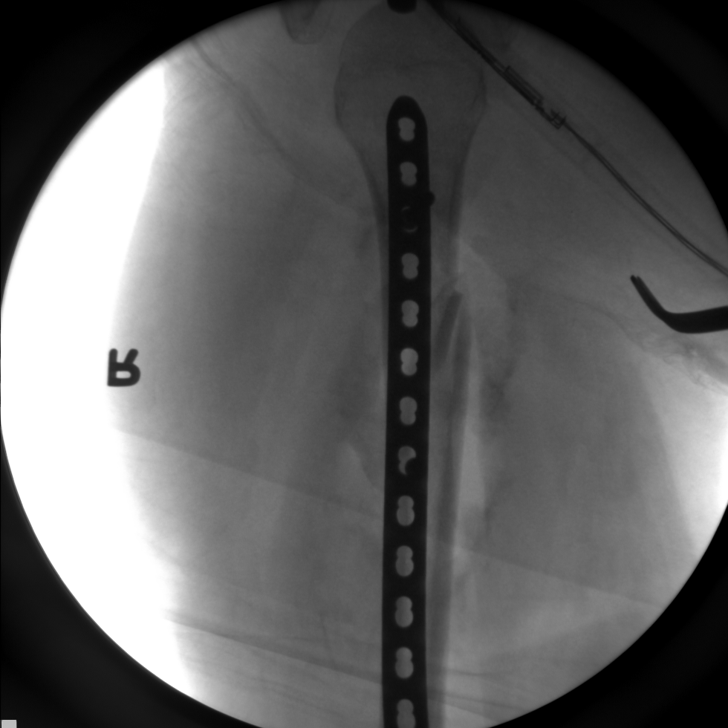
[im 3/9]
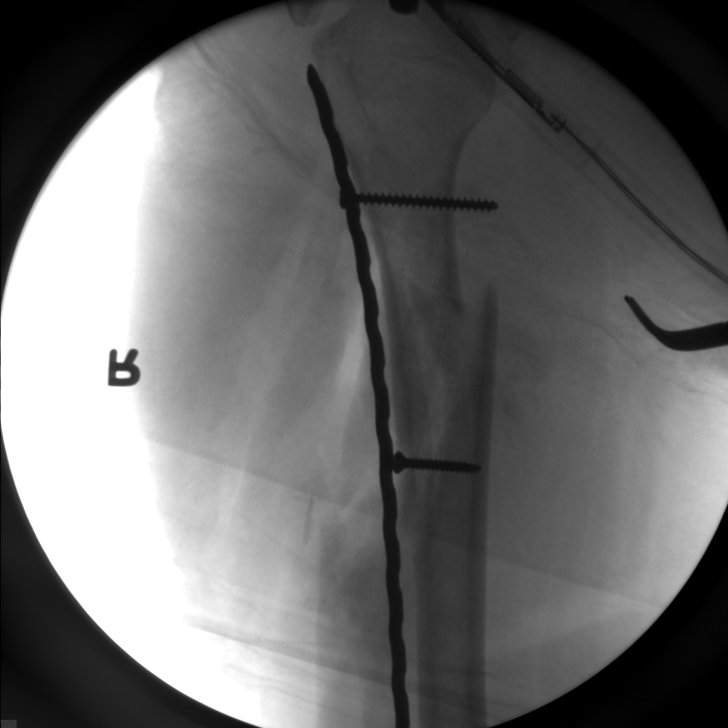
[im 4/9]
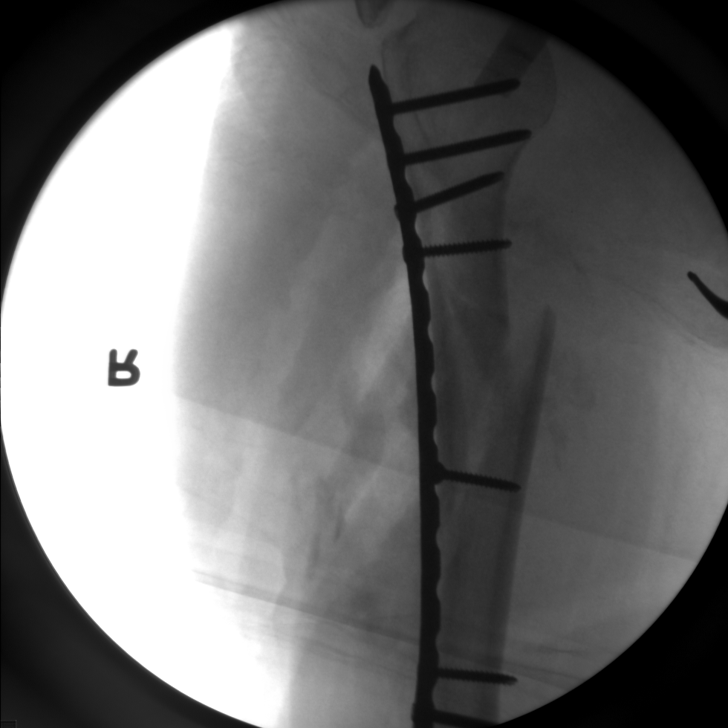
[im 5/9]
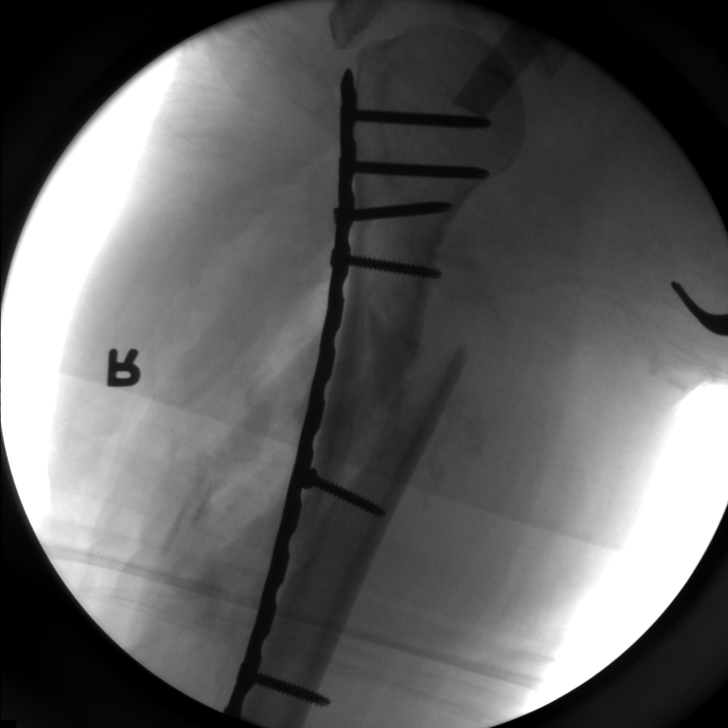
[im 6/9]
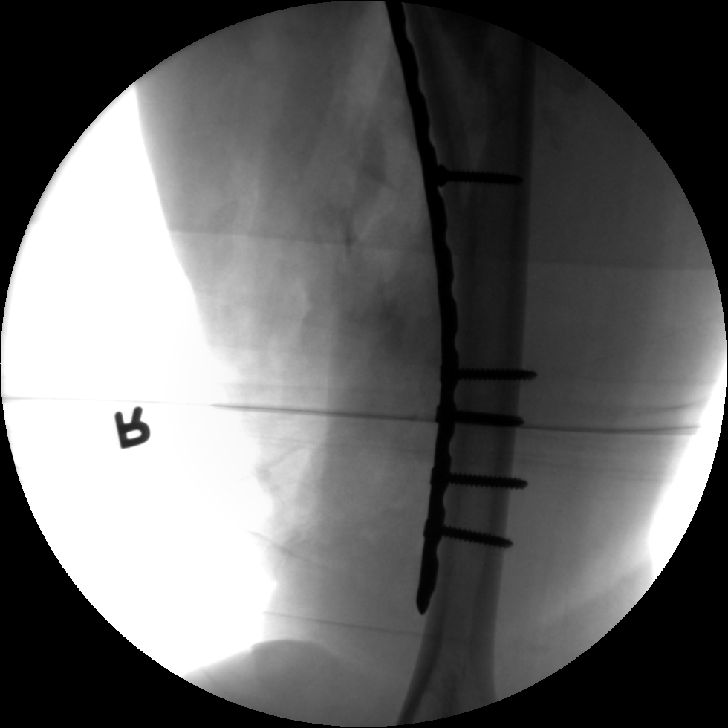
[im 7/9]
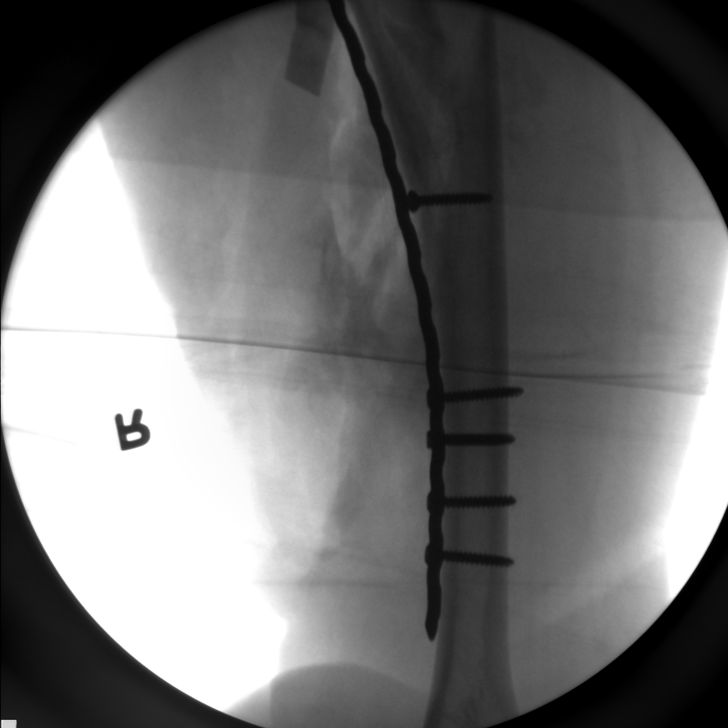
[im 8/9]
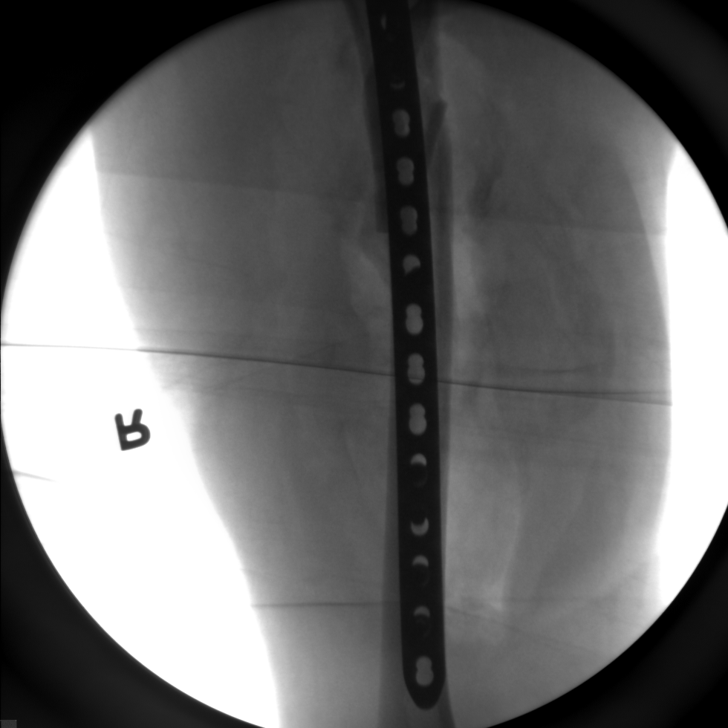
[im 9/9]
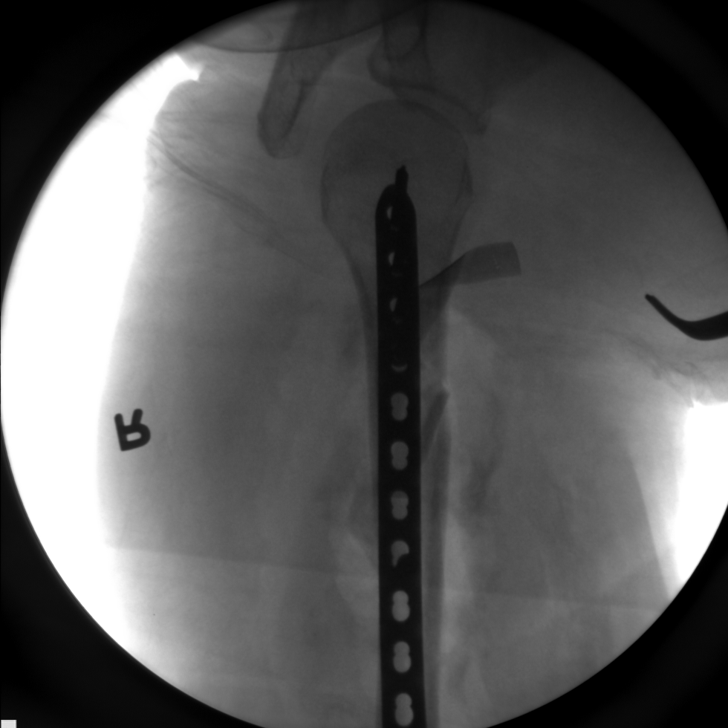

[9 of 9 positions shown; findings below may reference images not displayed]

FINDINGS: We are provided with nine fluoroscopic intraoperative spot views of
the right humerus. Images demonstrate placement of plate and screws
for fixation of a right humerus fracture. Position and alignment are
improved. No acute abnormality is identified.
IMPRESSION: ORIF right humerus fracture.

## 2015-01-14 IMAGING — CR DG HUMERUS 2V *R*
2 series · 2 of 2 positions shown · non-contrast
Comparison: Plain films of the right humerus 03/16/2013.

CLINICAL DATA: Status post fracture fixation.

EXAM:
RIGHT HUMERUS - 2+ VIEW

[AP]
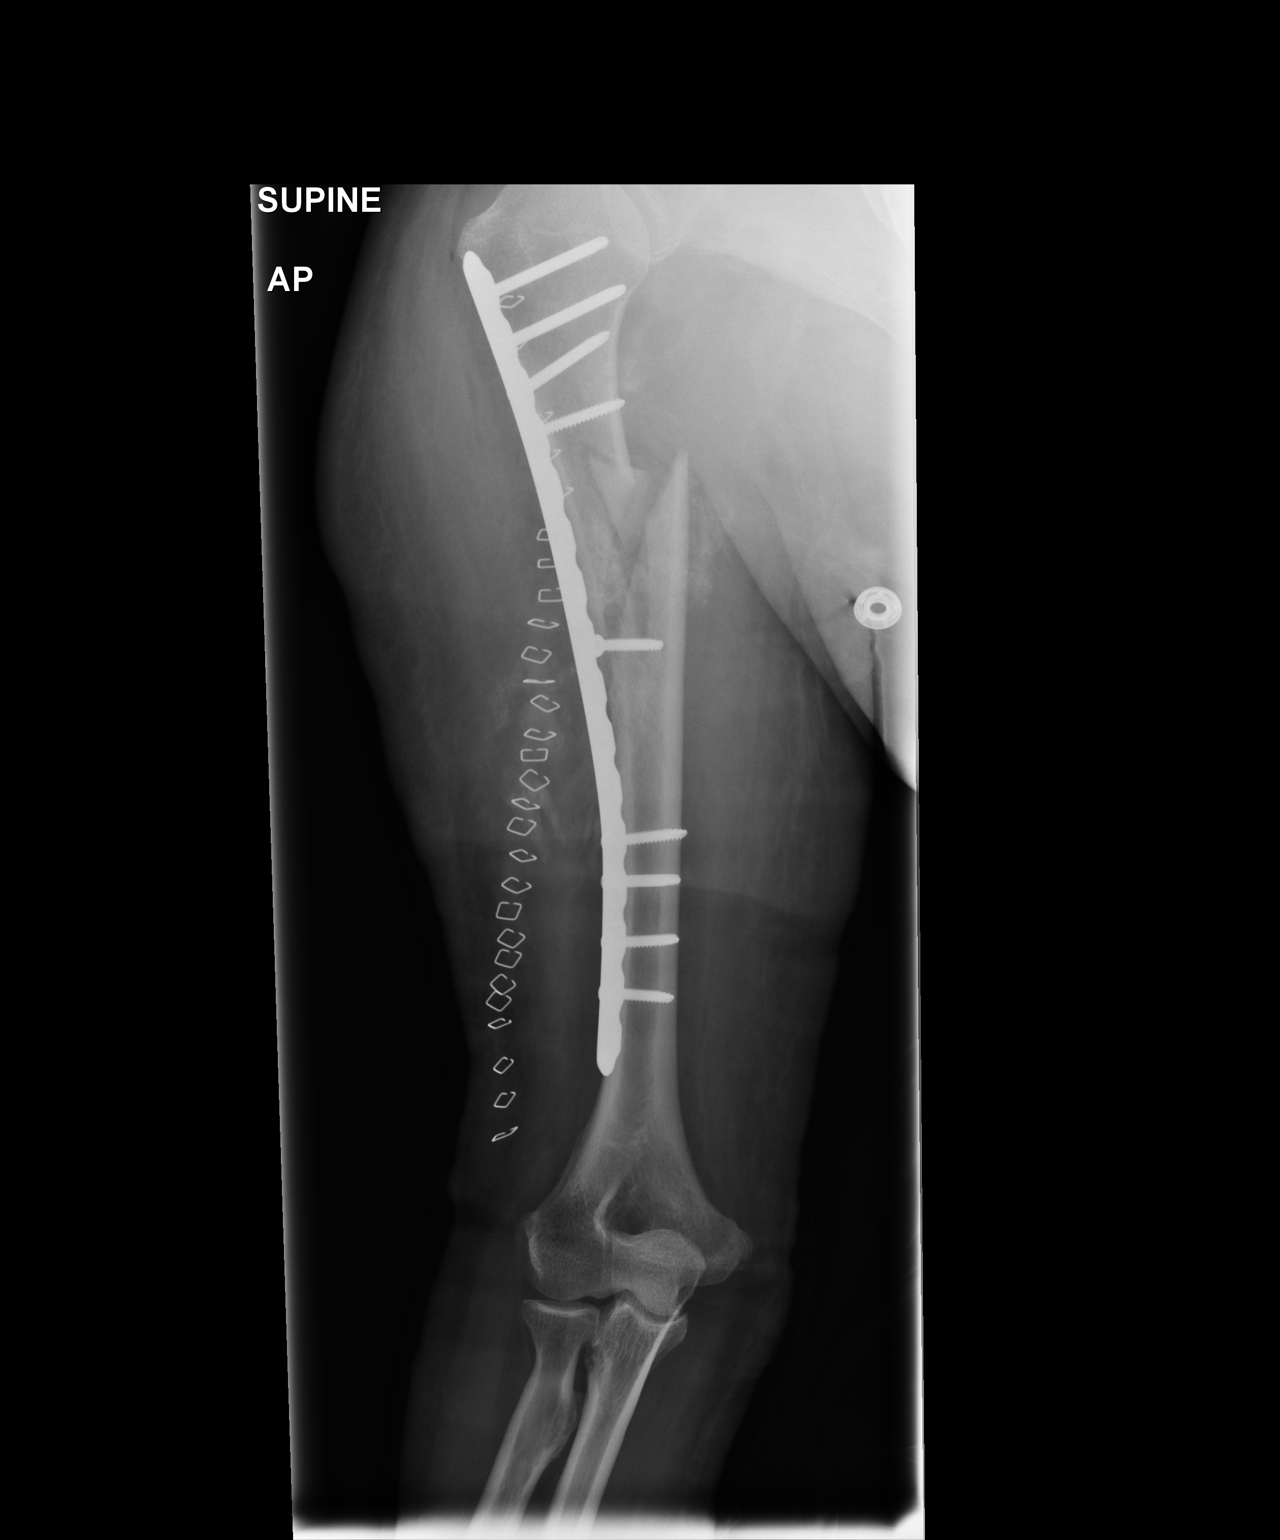

[lateral]
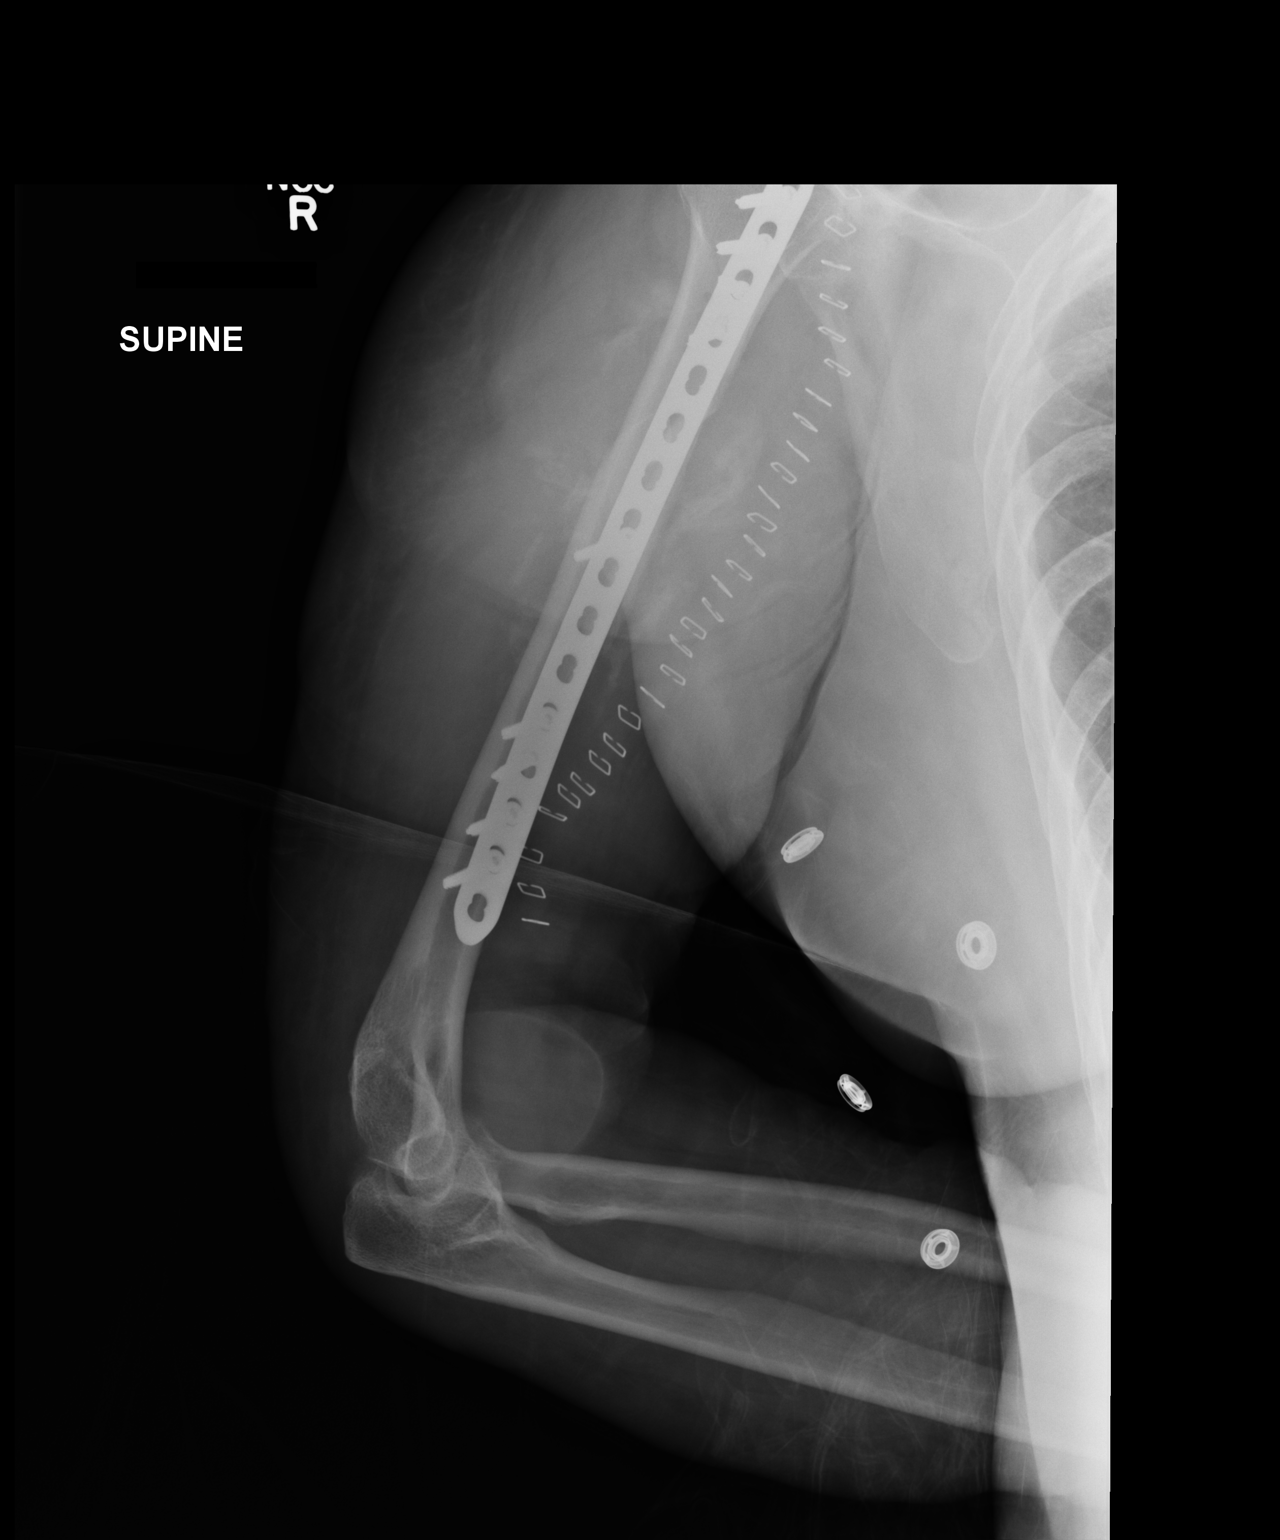

[2 of 2 positions shown; findings below may reference images not displayed]

FINDINGS: Plate and screws and interfragmentary screw are in place for
fixation of a diaphyseal fracture of the right humerus. Hardware is
intact. Mild lateral angulation of the fracture is noted. Position
and alignment appear improved.
IMPRESSION: ORIF right humerus fracture as described.

## 2016-03-18 DIAGNOSIS — R55 Syncope and collapse: Secondary | ICD-10-CM | POA: Diagnosis not present

## 2016-03-18 DIAGNOSIS — J441 Chronic obstructive pulmonary disease with (acute) exacerbation: Secondary | ICD-10-CM | POA: Diagnosis not present

## 2016-03-18 DIAGNOSIS — J9811 Atelectasis: Secondary | ICD-10-CM | POA: Diagnosis not present

## 2016-03-18 DIAGNOSIS — R079 Chest pain, unspecified: Secondary | ICD-10-CM | POA: Diagnosis not present

## 2016-03-18 DIAGNOSIS — N008 Acute nephritic syndrome with other morphologic changes: Secondary | ICD-10-CM | POA: Diagnosis not present

## 2016-03-18 DIAGNOSIS — J9601 Acute respiratory failure with hypoxia: Secondary | ICD-10-CM | POA: Diagnosis not present

## 2016-03-18 DIAGNOSIS — R0902 Hypoxemia: Secondary | ICD-10-CM | POA: Diagnosis not present

## 2016-03-18 DIAGNOSIS — Z9181 History of falling: Secondary | ICD-10-CM | POA: Diagnosis not present

## 2016-03-18 DIAGNOSIS — Z23 Encounter for immunization: Secondary | ICD-10-CM | POA: Diagnosis not present

## 2016-03-18 DIAGNOSIS — F17210 Nicotine dependence, cigarettes, uncomplicated: Secondary | ICD-10-CM | POA: Diagnosis not present

## 2016-03-18 DIAGNOSIS — R0602 Shortness of breath: Secondary | ICD-10-CM | POA: Diagnosis not present

## 2016-03-18 DIAGNOSIS — R000 Tachycardia, unspecified: Secondary | ICD-10-CM | POA: Diagnosis not present

## 2016-03-18 DIAGNOSIS — R0781 Pleurodynia: Secondary | ICD-10-CM | POA: Diagnosis not present

## 2016-03-18 DIAGNOSIS — Z716 Tobacco abuse counseling: Secondary | ICD-10-CM | POA: Diagnosis not present

## 2016-03-19 DIAGNOSIS — R079 Chest pain, unspecified: Secondary | ICD-10-CM | POA: Diagnosis not present

## 2016-03-19 DIAGNOSIS — R000 Tachycardia, unspecified: Secondary | ICD-10-CM | POA: Diagnosis not present

## 2016-03-19 DIAGNOSIS — J9601 Acute respiratory failure with hypoxia: Secondary | ICD-10-CM | POA: Diagnosis not present

## 2016-03-19 DIAGNOSIS — N008 Acute nephritic syndrome with other morphologic changes: Secondary | ICD-10-CM | POA: Diagnosis not present

## 2016-03-19 DIAGNOSIS — R55 Syncope and collapse: Secondary | ICD-10-CM | POA: Diagnosis not present

## 2016-04-23 DIAGNOSIS — Z6826 Body mass index (BMI) 26.0-26.9, adult: Secondary | ICD-10-CM | POA: Diagnosis not present

## 2016-04-23 DIAGNOSIS — I10 Essential (primary) hypertension: Secondary | ICD-10-CM | POA: Diagnosis not present

## 2016-04-23 DIAGNOSIS — E538 Deficiency of other specified B group vitamins: Secondary | ICD-10-CM | POA: Diagnosis not present

## 2016-04-23 DIAGNOSIS — E663 Overweight: Secondary | ICD-10-CM | POA: Diagnosis not present

## 2016-04-23 DIAGNOSIS — J449 Chronic obstructive pulmonary disease, unspecified: Secondary | ICD-10-CM | POA: Diagnosis not present

## 2016-04-23 DIAGNOSIS — Z79899 Other long term (current) drug therapy: Secondary | ICD-10-CM | POA: Diagnosis not present

## 2016-04-23 DIAGNOSIS — Z9181 History of falling: Secondary | ICD-10-CM | POA: Diagnosis not present

## 2016-04-23 DIAGNOSIS — B1920 Unspecified viral hepatitis C without hepatic coma: Secondary | ICD-10-CM | POA: Diagnosis not present

## 2016-04-23 DIAGNOSIS — E559 Vitamin D deficiency, unspecified: Secondary | ICD-10-CM | POA: Diagnosis not present

## 2016-04-23 DIAGNOSIS — Z1389 Encounter for screening for other disorder: Secondary | ICD-10-CM | POA: Diagnosis not present

## 2017-07-22 DIAGNOSIS — R740 Nonspecific elevation of levels of transaminase and lactic acid dehydrogenase [LDH]: Secondary | ICD-10-CM | POA: Diagnosis not present

## 2017-07-22 DIAGNOSIS — R600 Localized edema: Secondary | ICD-10-CM | POA: Diagnosis not present

## 2017-07-22 DIAGNOSIS — F1010 Alcohol abuse, uncomplicated: Secondary | ICD-10-CM | POA: Diagnosis not present

## 2017-07-22 DIAGNOSIS — J441 Chronic obstructive pulmonary disease with (acute) exacerbation: Secondary | ICD-10-CM | POA: Diagnosis not present

## 2017-07-22 DIAGNOSIS — K7010 Alcoholic hepatitis without ascites: Secondary | ICD-10-CM | POA: Diagnosis not present

## 2017-07-22 DIAGNOSIS — J449 Chronic obstructive pulmonary disease, unspecified: Secondary | ICD-10-CM | POA: Diagnosis not present

## 2017-07-22 DIAGNOSIS — F1020 Alcohol dependence, uncomplicated: Secondary | ICD-10-CM | POA: Diagnosis not present

## 2017-07-22 DIAGNOSIS — I5032 Chronic diastolic (congestive) heart failure: Secondary | ICD-10-CM | POA: Diagnosis not present

## 2017-07-22 DIAGNOSIS — Z66 Do not resuscitate: Secondary | ICD-10-CM | POA: Diagnosis not present

## 2017-07-22 DIAGNOSIS — Z720 Tobacco use: Secondary | ICD-10-CM | POA: Diagnosis not present

## 2017-07-22 DIAGNOSIS — K219 Gastro-esophageal reflux disease without esophagitis: Secondary | ICD-10-CM | POA: Diagnosis not present

## 2017-07-22 DIAGNOSIS — J9601 Acute respiratory failure with hypoxia: Secondary | ICD-10-CM | POA: Diagnosis not present

## 2017-07-22 DIAGNOSIS — I10 Essential (primary) hypertension: Secondary | ICD-10-CM | POA: Diagnosis not present

## 2017-07-22 DIAGNOSIS — R739 Hyperglycemia, unspecified: Secondary | ICD-10-CM | POA: Diagnosis not present

## 2017-07-22 DIAGNOSIS — R531 Weakness: Secondary | ICD-10-CM | POA: Diagnosis not present

## 2017-07-22 DIAGNOSIS — R0602 Shortness of breath: Secondary | ICD-10-CM | POA: Diagnosis not present

## 2017-07-22 DIAGNOSIS — J9600 Acute respiratory failure, unspecified whether with hypoxia or hypercapnia: Secondary | ICD-10-CM | POA: Diagnosis not present

## 2017-07-22 DIAGNOSIS — F17210 Nicotine dependence, cigarettes, uncomplicated: Secondary | ICD-10-CM | POA: Diagnosis not present

## 2017-07-22 DIAGNOSIS — F419 Anxiety disorder, unspecified: Secondary | ICD-10-CM | POA: Diagnosis not present

## 2017-07-22 DIAGNOSIS — R5381 Other malaise: Secondary | ICD-10-CM | POA: Diagnosis not present

## 2017-07-23 DIAGNOSIS — F1010 Alcohol abuse, uncomplicated: Secondary | ICD-10-CM | POA: Diagnosis not present

## 2017-07-23 DIAGNOSIS — J9601 Acute respiratory failure with hypoxia: Secondary | ICD-10-CM | POA: Diagnosis not present

## 2017-07-23 DIAGNOSIS — Z720 Tobacco use: Secondary | ICD-10-CM | POA: Diagnosis not present

## 2017-07-23 DIAGNOSIS — R740 Nonspecific elevation of levels of transaminase and lactic acid dehydrogenase [LDH]: Secondary | ICD-10-CM | POA: Diagnosis not present

## 2017-07-23 DIAGNOSIS — R739 Hyperglycemia, unspecified: Secondary | ICD-10-CM | POA: Diagnosis not present

## 2017-07-28 DIAGNOSIS — K219 Gastro-esophageal reflux disease without esophagitis: Secondary | ICD-10-CM | POA: Diagnosis not present

## 2017-07-28 DIAGNOSIS — K7010 Alcoholic hepatitis without ascites: Secondary | ICD-10-CM | POA: Diagnosis not present

## 2017-07-28 DIAGNOSIS — F1020 Alcohol dependence, uncomplicated: Secondary | ICD-10-CM | POA: Diagnosis not present

## 2017-07-28 DIAGNOSIS — Z66 Do not resuscitate: Secondary | ICD-10-CM | POA: Diagnosis not present

## 2017-07-28 DIAGNOSIS — R0602 Shortness of breath: Secondary | ICD-10-CM | POA: Diagnosis not present

## 2017-07-28 DIAGNOSIS — J449 Chronic obstructive pulmonary disease, unspecified: Secondary | ICD-10-CM | POA: Diagnosis not present

## 2017-07-28 DIAGNOSIS — R739 Hyperglycemia, unspecified: Secondary | ICD-10-CM | POA: Diagnosis not present

## 2017-07-28 DIAGNOSIS — J9611 Chronic respiratory failure with hypoxia: Secondary | ICD-10-CM | POA: Diagnosis not present

## 2017-07-28 DIAGNOSIS — R740 Nonspecific elevation of levels of transaminase and lactic acid dehydrogenase [LDH]: Secondary | ICD-10-CM | POA: Diagnosis not present

## 2017-07-28 DIAGNOSIS — I5033 Acute on chronic diastolic (congestive) heart failure: Secondary | ICD-10-CM | POA: Diagnosis not present

## 2017-07-28 DIAGNOSIS — F1010 Alcohol abuse, uncomplicated: Secondary | ICD-10-CM | POA: Diagnosis not present

## 2017-07-28 DIAGNOSIS — J441 Chronic obstructive pulmonary disease with (acute) exacerbation: Secondary | ICD-10-CM | POA: Diagnosis not present

## 2017-07-28 DIAGNOSIS — I5032 Chronic diastolic (congestive) heart failure: Secondary | ICD-10-CM | POA: Diagnosis not present

## 2017-07-28 DIAGNOSIS — I10 Essential (primary) hypertension: Secondary | ICD-10-CM | POA: Diagnosis not present

## 2017-07-28 DIAGNOSIS — R262 Difficulty in walking, not elsewhere classified: Secondary | ICD-10-CM | POA: Diagnosis not present

## 2017-07-28 DIAGNOSIS — Z720 Tobacco use: Secondary | ICD-10-CM | POA: Diagnosis not present

## 2017-07-28 DIAGNOSIS — F419 Anxiety disorder, unspecified: Secondary | ICD-10-CM | POA: Diagnosis not present

## 2017-07-28 DIAGNOSIS — R5381 Other malaise: Secondary | ICD-10-CM | POA: Diagnosis not present

## 2017-07-28 DIAGNOSIS — F17210 Nicotine dependence, cigarettes, uncomplicated: Secondary | ICD-10-CM | POA: Diagnosis not present

## 2017-07-28 DIAGNOSIS — R531 Weakness: Secondary | ICD-10-CM | POA: Diagnosis not present

## 2017-07-28 DIAGNOSIS — J9601 Acute respiratory failure with hypoxia: Secondary | ICD-10-CM | POA: Diagnosis not present

## 2017-07-28 DIAGNOSIS — J9600 Acute respiratory failure, unspecified whether with hypoxia or hypercapnia: Secondary | ICD-10-CM | POA: Diagnosis not present

## 2017-07-29 DIAGNOSIS — R262 Difficulty in walking, not elsewhere classified: Secondary | ICD-10-CM | POA: Diagnosis not present

## 2017-07-29 DIAGNOSIS — J441 Chronic obstructive pulmonary disease with (acute) exacerbation: Secondary | ICD-10-CM | POA: Diagnosis not present

## 2017-07-29 DIAGNOSIS — I5033 Acute on chronic diastolic (congestive) heart failure: Secondary | ICD-10-CM | POA: Diagnosis not present

## 2017-07-29 DIAGNOSIS — J9611 Chronic respiratory failure with hypoxia: Secondary | ICD-10-CM | POA: Diagnosis not present

## 2017-08-06 DIAGNOSIS — F1020 Alcohol dependence, uncomplicated: Secondary | ICD-10-CM | POA: Diagnosis not present

## 2017-08-06 DIAGNOSIS — K7010 Alcoholic hepatitis without ascites: Secondary | ICD-10-CM | POA: Diagnosis not present

## 2017-08-06 DIAGNOSIS — F419 Anxiety disorder, unspecified: Secondary | ICD-10-CM | POA: Diagnosis not present

## 2017-08-06 DIAGNOSIS — I5032 Chronic diastolic (congestive) heart failure: Secondary | ICD-10-CM | POA: Diagnosis not present

## 2017-08-06 DIAGNOSIS — F17210 Nicotine dependence, cigarettes, uncomplicated: Secondary | ICD-10-CM | POA: Diagnosis not present

## 2017-08-06 DIAGNOSIS — J441 Chronic obstructive pulmonary disease with (acute) exacerbation: Secondary | ICD-10-CM | POA: Diagnosis not present

## 2017-08-06 DIAGNOSIS — I10 Essential (primary) hypertension: Secondary | ICD-10-CM | POA: Diagnosis not present

## 2017-08-06 DIAGNOSIS — R262 Difficulty in walking, not elsewhere classified: Secondary | ICD-10-CM | POA: Diagnosis not present

## 2017-08-06 DIAGNOSIS — R0602 Shortness of breath: Secondary | ICD-10-CM | POA: Diagnosis not present

## 2017-08-06 DIAGNOSIS — J9601 Acute respiratory failure with hypoxia: Secondary | ICD-10-CM | POA: Diagnosis not present

## 2017-08-06 DIAGNOSIS — R531 Weakness: Secondary | ICD-10-CM | POA: Diagnosis not present

## 2017-08-21 DIAGNOSIS — K7010 Alcoholic hepatitis without ascites: Secondary | ICD-10-CM | POA: Diagnosis not present

## 2017-08-21 DIAGNOSIS — I110 Hypertensive heart disease with heart failure: Secondary | ICD-10-CM | POA: Diagnosis not present

## 2017-08-21 DIAGNOSIS — J9691 Respiratory failure, unspecified with hypoxia: Secondary | ICD-10-CM | POA: Diagnosis not present

## 2017-08-21 DIAGNOSIS — K7030 Alcoholic cirrhosis of liver without ascites: Secondary | ICD-10-CM | POA: Diagnosis not present

## 2017-08-21 DIAGNOSIS — J441 Chronic obstructive pulmonary disease with (acute) exacerbation: Secondary | ICD-10-CM | POA: Diagnosis not present

## 2017-08-21 DIAGNOSIS — I5032 Chronic diastolic (congestive) heart failure: Secondary | ICD-10-CM | POA: Diagnosis not present

## 2017-08-25 DIAGNOSIS — J449 Chronic obstructive pulmonary disease, unspecified: Secondary | ICD-10-CM | POA: Diagnosis not present

## 2017-08-25 DIAGNOSIS — E538 Deficiency of other specified B group vitamins: Secondary | ICD-10-CM | POA: Diagnosis not present

## 2017-08-25 DIAGNOSIS — I5032 Chronic diastolic (congestive) heart failure: Secondary | ICD-10-CM | POA: Diagnosis not present

## 2017-08-25 DIAGNOSIS — Z716 Tobacco abuse counseling: Secondary | ICD-10-CM | POA: Diagnosis not present

## 2017-08-25 DIAGNOSIS — B1920 Unspecified viral hepatitis C without hepatic coma: Secondary | ICD-10-CM | POA: Diagnosis not present

## 2017-08-25 DIAGNOSIS — Z79899 Other long term (current) drug therapy: Secondary | ICD-10-CM | POA: Diagnosis not present

## 2017-08-25 DIAGNOSIS — Z6827 Body mass index (BMI) 27.0-27.9, adult: Secondary | ICD-10-CM | POA: Diagnosis not present

## 2017-08-25 DIAGNOSIS — R29898 Other symptoms and signs involving the musculoskeletal system: Secondary | ICD-10-CM | POA: Diagnosis not present

## 2017-08-25 DIAGNOSIS — E559 Vitamin D deficiency, unspecified: Secondary | ICD-10-CM | POA: Diagnosis not present

## 2017-08-27 DIAGNOSIS — I110 Hypertensive heart disease with heart failure: Secondary | ICD-10-CM | POA: Diagnosis not present

## 2017-08-27 DIAGNOSIS — K7030 Alcoholic cirrhosis of liver without ascites: Secondary | ICD-10-CM | POA: Diagnosis not present

## 2017-08-27 DIAGNOSIS — J441 Chronic obstructive pulmonary disease with (acute) exacerbation: Secondary | ICD-10-CM | POA: Diagnosis not present

## 2017-08-27 DIAGNOSIS — K7010 Alcoholic hepatitis without ascites: Secondary | ICD-10-CM | POA: Diagnosis not present

## 2017-08-27 DIAGNOSIS — J9691 Respiratory failure, unspecified with hypoxia: Secondary | ICD-10-CM | POA: Diagnosis not present

## 2017-08-27 DIAGNOSIS — I5032 Chronic diastolic (congestive) heart failure: Secondary | ICD-10-CM | POA: Diagnosis not present

## 2017-08-29 DIAGNOSIS — K7010 Alcoholic hepatitis without ascites: Secondary | ICD-10-CM | POA: Diagnosis not present

## 2017-08-29 DIAGNOSIS — J9691 Respiratory failure, unspecified with hypoxia: Secondary | ICD-10-CM | POA: Diagnosis not present

## 2017-08-29 DIAGNOSIS — K7030 Alcoholic cirrhosis of liver without ascites: Secondary | ICD-10-CM | POA: Diagnosis not present

## 2017-08-29 DIAGNOSIS — J441 Chronic obstructive pulmonary disease with (acute) exacerbation: Secondary | ICD-10-CM | POA: Diagnosis not present

## 2017-08-29 DIAGNOSIS — I5032 Chronic diastolic (congestive) heart failure: Secondary | ICD-10-CM | POA: Diagnosis not present

## 2017-08-29 DIAGNOSIS — I110 Hypertensive heart disease with heart failure: Secondary | ICD-10-CM | POA: Diagnosis not present

## 2017-09-03 DIAGNOSIS — I5032 Chronic diastolic (congestive) heart failure: Secondary | ICD-10-CM | POA: Diagnosis not present

## 2017-09-03 DIAGNOSIS — J9691 Respiratory failure, unspecified with hypoxia: Secondary | ICD-10-CM | POA: Diagnosis not present

## 2017-09-03 DIAGNOSIS — K7010 Alcoholic hepatitis without ascites: Secondary | ICD-10-CM | POA: Diagnosis not present

## 2017-09-03 DIAGNOSIS — I110 Hypertensive heart disease with heart failure: Secondary | ICD-10-CM | POA: Diagnosis not present

## 2017-09-03 DIAGNOSIS — K7030 Alcoholic cirrhosis of liver without ascites: Secondary | ICD-10-CM | POA: Diagnosis not present

## 2017-09-03 DIAGNOSIS — J441 Chronic obstructive pulmonary disease with (acute) exacerbation: Secondary | ICD-10-CM | POA: Diagnosis not present

## 2017-09-05 DIAGNOSIS — J441 Chronic obstructive pulmonary disease with (acute) exacerbation: Secondary | ICD-10-CM | POA: Diagnosis not present

## 2017-09-05 DIAGNOSIS — K7010 Alcoholic hepatitis without ascites: Secondary | ICD-10-CM | POA: Diagnosis not present

## 2017-09-05 DIAGNOSIS — I110 Hypertensive heart disease with heart failure: Secondary | ICD-10-CM | POA: Diagnosis not present

## 2017-09-05 DIAGNOSIS — J9691 Respiratory failure, unspecified with hypoxia: Secondary | ICD-10-CM | POA: Diagnosis not present

## 2017-09-05 DIAGNOSIS — K7030 Alcoholic cirrhosis of liver without ascites: Secondary | ICD-10-CM | POA: Diagnosis not present

## 2017-09-05 DIAGNOSIS — I5032 Chronic diastolic (congestive) heart failure: Secondary | ICD-10-CM | POA: Diagnosis not present

## 2017-09-11 DIAGNOSIS — J441 Chronic obstructive pulmonary disease with (acute) exacerbation: Secondary | ICD-10-CM | POA: Diagnosis not present

## 2017-09-11 DIAGNOSIS — J9691 Respiratory failure, unspecified with hypoxia: Secondary | ICD-10-CM | POA: Diagnosis not present

## 2017-09-11 DIAGNOSIS — I5032 Chronic diastolic (congestive) heart failure: Secondary | ICD-10-CM | POA: Diagnosis not present

## 2017-09-11 DIAGNOSIS — K7010 Alcoholic hepatitis without ascites: Secondary | ICD-10-CM | POA: Diagnosis not present

## 2017-09-11 DIAGNOSIS — I110 Hypertensive heart disease with heart failure: Secondary | ICD-10-CM | POA: Diagnosis not present

## 2017-09-11 DIAGNOSIS — K7030 Alcoholic cirrhosis of liver without ascites: Secondary | ICD-10-CM | POA: Diagnosis not present

## 2017-09-14 DIAGNOSIS — I5032 Chronic diastolic (congestive) heart failure: Secondary | ICD-10-CM | POA: Diagnosis not present

## 2017-09-14 DIAGNOSIS — J441 Chronic obstructive pulmonary disease with (acute) exacerbation: Secondary | ICD-10-CM | POA: Diagnosis not present

## 2017-09-14 DIAGNOSIS — R262 Difficulty in walking, not elsewhere classified: Secondary | ICD-10-CM | POA: Diagnosis not present

## 2017-09-14 DIAGNOSIS — J9601 Acute respiratory failure with hypoxia: Secondary | ICD-10-CM | POA: Diagnosis not present

## 2017-10-14 DIAGNOSIS — I5032 Chronic diastolic (congestive) heart failure: Secondary | ICD-10-CM | POA: Diagnosis not present

## 2017-10-14 DIAGNOSIS — J9601 Acute respiratory failure with hypoxia: Secondary | ICD-10-CM | POA: Diagnosis not present

## 2017-10-14 DIAGNOSIS — R262 Difficulty in walking, not elsewhere classified: Secondary | ICD-10-CM | POA: Diagnosis not present

## 2017-10-14 DIAGNOSIS — J441 Chronic obstructive pulmonary disease with (acute) exacerbation: Secondary | ICD-10-CM | POA: Diagnosis not present

## 2017-11-14 DIAGNOSIS — I5032 Chronic diastolic (congestive) heart failure: Secondary | ICD-10-CM | POA: Diagnosis not present

## 2017-11-14 DIAGNOSIS — R262 Difficulty in walking, not elsewhere classified: Secondary | ICD-10-CM | POA: Diagnosis not present

## 2017-11-14 DIAGNOSIS — J9601 Acute respiratory failure with hypoxia: Secondary | ICD-10-CM | POA: Diagnosis not present

## 2017-11-14 DIAGNOSIS — J441 Chronic obstructive pulmonary disease with (acute) exacerbation: Secondary | ICD-10-CM | POA: Diagnosis not present

## 2017-11-24 DIAGNOSIS — R262 Difficulty in walking, not elsewhere classified: Secondary | ICD-10-CM | POA: Diagnosis not present

## 2017-11-24 DIAGNOSIS — W19XXXA Unspecified fall, initial encounter: Secondary | ICD-10-CM | POA: Diagnosis not present

## 2017-11-24 DIAGNOSIS — R740 Nonspecific elevation of levels of transaminase and lactic acid dehydrogenase [LDH]: Secondary | ICD-10-CM | POA: Diagnosis not present

## 2017-11-24 DIAGNOSIS — Z7289 Other problems related to lifestyle: Secondary | ICD-10-CM | POA: Diagnosis not present

## 2017-11-24 DIAGNOSIS — R000 Tachycardia, unspecified: Secondary | ICD-10-CM | POA: Diagnosis not present

## 2017-11-24 DIAGNOSIS — Z8679 Personal history of other diseases of the circulatory system: Secondary | ICD-10-CM | POA: Diagnosis not present

## 2017-11-24 DIAGNOSIS — F1020 Alcohol dependence, uncomplicated: Secondary | ICD-10-CM | POA: Diagnosis not present

## 2017-11-24 DIAGNOSIS — F419 Anxiety disorder, unspecified: Secondary | ICD-10-CM | POA: Diagnosis not present

## 2017-11-24 DIAGNOSIS — A419 Sepsis, unspecified organism: Secondary | ICD-10-CM | POA: Diagnosis not present

## 2017-11-24 DIAGNOSIS — R079 Chest pain, unspecified: Secondary | ICD-10-CM | POA: Diagnosis not present

## 2017-11-24 DIAGNOSIS — R112 Nausea with vomiting, unspecified: Secondary | ICD-10-CM | POA: Diagnosis not present

## 2017-11-24 DIAGNOSIS — R0789 Other chest pain: Secondary | ICD-10-CM | POA: Diagnosis not present

## 2017-11-24 DIAGNOSIS — I5033 Acute on chronic diastolic (congestive) heart failure: Secondary | ICD-10-CM | POA: Diagnosis not present

## 2017-11-24 DIAGNOSIS — I5032 Chronic diastolic (congestive) heart failure: Secondary | ICD-10-CM | POA: Diagnosis not present

## 2017-11-24 DIAGNOSIS — B1920 Unspecified viral hepatitis C without hepatic coma: Secondary | ICD-10-CM | POA: Diagnosis not present

## 2017-11-24 DIAGNOSIS — B9620 Unspecified Escherichia coli [E. coli] as the cause of diseases classified elsewhere: Secondary | ICD-10-CM | POA: Diagnosis not present

## 2017-11-24 DIAGNOSIS — K7010 Alcoholic hepatitis without ascites: Secondary | ICD-10-CM | POA: Diagnosis not present

## 2017-11-24 DIAGNOSIS — R0902 Hypoxemia: Secondary | ICD-10-CM | POA: Diagnosis not present

## 2017-11-24 DIAGNOSIS — J449 Chronic obstructive pulmonary disease, unspecified: Secondary | ICD-10-CM | POA: Diagnosis not present

## 2017-11-24 DIAGNOSIS — R945 Abnormal results of liver function studies: Secondary | ICD-10-CM | POA: Diagnosis not present

## 2017-11-24 DIAGNOSIS — N390 Urinary tract infection, site not specified: Secondary | ICD-10-CM | POA: Diagnosis not present

## 2017-11-24 DIAGNOSIS — R109 Unspecified abdominal pain: Secondary | ICD-10-CM | POA: Diagnosis not present

## 2017-11-24 DIAGNOSIS — Z720 Tobacco use: Secondary | ICD-10-CM | POA: Diagnosis not present

## 2017-11-24 DIAGNOSIS — I10 Essential (primary) hypertension: Secondary | ICD-10-CM | POA: Diagnosis not present

## 2017-11-24 DIAGNOSIS — J439 Emphysema, unspecified: Secondary | ICD-10-CM | POA: Diagnosis not present

## 2017-11-24 DIAGNOSIS — B9689 Other specified bacterial agents as the cause of diseases classified elsewhere: Secondary | ICD-10-CM | POA: Diagnosis not present

## 2017-11-25 DIAGNOSIS — R079 Chest pain, unspecified: Secondary | ICD-10-CM

## 2017-11-26 DIAGNOSIS — Z7289 Other problems related to lifestyle: Secondary | ICD-10-CM

## 2017-11-27 ENCOUNTER — Other Ambulatory Visit: Payer: Self-pay | Admitting: *Deleted

## 2017-11-27 NOTE — Patient Outreach (Signed)
[facility]) Care Management  11/27/2017  [name] [name] October 08, 1950 [id number]   Transition of Care Referral   Referral Date: 11/27/17 Referral Source: human[employer] report Date of Admission:  Diagnosis:  Date of Discharge:11/26/17 Facility: [facility]  Insurance: [employer]  Outreach attempt # 1 Outreach attempt to patient. No answer and unable to leave voicemail message. The phone mail box is full for the only contact number listed in Epic  Plan: [facility] CM sent an unsuccessful outreach letter and scheduled this patient for another call attempt within 4 business days  [name] L. Noelle PennerGibbs, RN, BSN, CCM [facility] Telephonic Care Management Care Coordinator Direct Number [phone] Mobile number [phone]  Main [facility] number [phone] Fax number [phone]

## 2017-11-28 DIAGNOSIS — R945 Abnormal results of liver function studies: Secondary | ICD-10-CM | POA: Diagnosis not present

## 2017-11-28 DIAGNOSIS — F1020 Alcohol dependence, uncomplicated: Secondary | ICD-10-CM | POA: Diagnosis not present

## 2017-11-28 DIAGNOSIS — J449 Chronic obstructive pulmonary disease, unspecified: Secondary | ICD-10-CM | POA: Diagnosis not present

## 2017-11-28 DIAGNOSIS — R740 Nonspecific elevation of levels of transaminase and lactic acid dehydrogenase [LDH]: Secondary | ICD-10-CM | POA: Diagnosis not present

## 2017-11-28 DIAGNOSIS — Z720 Tobacco use: Secondary | ICD-10-CM | POA: Diagnosis not present

## 2017-11-28 DIAGNOSIS — F419 Anxiety disorder, unspecified: Secondary | ICD-10-CM | POA: Diagnosis not present

## 2017-11-28 DIAGNOSIS — I5032 Chronic diastolic (congestive) heart failure: Secondary | ICD-10-CM | POA: Diagnosis not present

## 2017-11-28 DIAGNOSIS — K7010 Alcoholic hepatitis without ascites: Secondary | ICD-10-CM | POA: Diagnosis not present

## 2017-11-28 DIAGNOSIS — R112 Nausea with vomiting, unspecified: Secondary | ICD-10-CM | POA: Diagnosis not present

## 2017-11-28 DIAGNOSIS — B1920 Unspecified viral hepatitis C without hepatic coma: Secondary | ICD-10-CM | POA: Diagnosis not present

## 2017-11-28 DIAGNOSIS — I10 Essential (primary) hypertension: Secondary | ICD-10-CM | POA: Diagnosis not present

## 2017-11-28 DIAGNOSIS — A419 Sepsis, unspecified organism: Secondary | ICD-10-CM | POA: Diagnosis not present

## 2017-11-28 DIAGNOSIS — Z7289 Other problems related to lifestyle: Secondary | ICD-10-CM | POA: Diagnosis not present

## 2017-11-28 DIAGNOSIS — N390 Urinary tract infection, site not specified: Secondary | ICD-10-CM | POA: Diagnosis not present

## 2017-11-28 DIAGNOSIS — M810 Age-related osteoporosis without current pathological fracture: Secondary | ICD-10-CM | POA: Diagnosis not present

## 2017-11-28 DIAGNOSIS — R262 Difficulty in walking, not elsewhere classified: Secondary | ICD-10-CM | POA: Diagnosis not present

## 2017-11-28 DIAGNOSIS — J9621 Acute and chronic respiratory failure with hypoxia: Secondary | ICD-10-CM | POA: Diagnosis not present

## 2017-12-01 ENCOUNTER — Ambulatory Visit: Payer: Self-pay | Admitting: *Deleted

## 2017-12-01 DIAGNOSIS — M810 Age-related osteoporosis without current pathological fracture: Secondary | ICD-10-CM | POA: Diagnosis not present

## 2017-12-01 DIAGNOSIS — J9621 Acute and chronic respiratory failure with hypoxia: Secondary | ICD-10-CM | POA: Diagnosis not present

## 2017-12-01 DIAGNOSIS — N390 Urinary tract infection, site not specified: Secondary | ICD-10-CM | POA: Diagnosis not present

## 2017-12-01 DIAGNOSIS — R262 Difficulty in walking, not elsewhere classified: Secondary | ICD-10-CM | POA: Diagnosis not present

## 2017-12-02 ENCOUNTER — Other Ambulatory Visit: Payer: Self-pay | Admitting: *Deleted

## 2017-12-02 NOTE — Patient Outreach (Signed)
[facility]) Care Management  12/02/2017  Fayne Norrieatricia A Capell 1951/01/06 [id number]   Transition of Care Referral  Referral Date: 11/27/17 Referral Source: humana report Date of Admission: unknown Diagnosis:  Unknown Date of Discharge: [facility] Facility: [facility]  Insurance: [employer]  Outreach attempt # 2 Successful patient informs [facility] "[facility]"   [facility] CM apologized for the error in the call about her discharged  [facility] CM [facility].  Advised patient that other post discharge calls may occur to assess how the patient is doing following at discharge from a facility. Patient voiced understanding and was appreciative of f/u call.  Plan: [facility] CM will close this case at this time as it does not warrant a transition of care call and the patient has external care management services [facility]  Kasir Hallenbeck L. Noelle PennerGibbs, RN, BSN, CCM [facility] Telephonic Care Management Care Coordinator Office number [phone] Mobile number [phone]  Main [facility] number [phone] number [phone]

## 2017-12-15 DIAGNOSIS — J9601 Acute respiratory failure with hypoxia: Secondary | ICD-10-CM | POA: Diagnosis not present

## 2017-12-15 DIAGNOSIS — J441 Chronic obstructive pulmonary disease with (acute) exacerbation: Secondary | ICD-10-CM | POA: Diagnosis not present

## 2017-12-15 DIAGNOSIS — I5032 Chronic diastolic (congestive) heart failure: Secondary | ICD-10-CM | POA: Diagnosis not present

## 2017-12-15 DIAGNOSIS — R262 Difficulty in walking, not elsewhere classified: Secondary | ICD-10-CM | POA: Diagnosis not present

## 2018-01-12 DIAGNOSIS — I5032 Chronic diastolic (congestive) heart failure: Secondary | ICD-10-CM | POA: Diagnosis not present

## 2018-01-12 DIAGNOSIS — R000 Tachycardia, unspecified: Secondary | ICD-10-CM | POA: Diagnosis not present

## 2018-01-12 DIAGNOSIS — F419 Anxiety disorder, unspecified: Secondary | ICD-10-CM | POA: Diagnosis not present

## 2018-01-12 DIAGNOSIS — R062 Wheezing: Secondary | ICD-10-CM | POA: Diagnosis not present

## 2018-01-12 DIAGNOSIS — J440 Chronic obstructive pulmonary disease with acute lower respiratory infection: Secondary | ICD-10-CM | POA: Diagnosis not present

## 2018-01-12 DIAGNOSIS — N390 Urinary tract infection, site not specified: Secondary | ICD-10-CM | POA: Diagnosis not present

## 2018-01-12 DIAGNOSIS — R0600 Dyspnea, unspecified: Secondary | ICD-10-CM | POA: Diagnosis not present

## 2018-01-12 DIAGNOSIS — F1020 Alcohol dependence, uncomplicated: Secondary | ICD-10-CM | POA: Diagnosis not present

## 2018-01-12 DIAGNOSIS — R0602 Shortness of breath: Secondary | ICD-10-CM | POA: Diagnosis not present

## 2018-01-12 DIAGNOSIS — E559 Vitamin D deficiency, unspecified: Secondary | ICD-10-CM | POA: Diagnosis not present

## 2018-01-12 DIAGNOSIS — R262 Difficulty in walking, not elsewhere classified: Secondary | ICD-10-CM | POA: Diagnosis not present

## 2018-01-12 DIAGNOSIS — R0689 Other abnormalities of breathing: Secondary | ICD-10-CM | POA: Diagnosis not present

## 2018-01-12 DIAGNOSIS — D649 Anemia, unspecified: Secondary | ICD-10-CM | POA: Diagnosis not present

## 2018-01-12 DIAGNOSIS — J189 Pneumonia, unspecified organism: Secondary | ICD-10-CM | POA: Diagnosis not present

## 2018-01-12 DIAGNOSIS — M2550 Pain in unspecified joint: Secondary | ICD-10-CM | POA: Diagnosis not present

## 2018-01-12 DIAGNOSIS — R05 Cough: Secondary | ICD-10-CM | POA: Diagnosis not present

## 2018-01-12 DIAGNOSIS — I509 Heart failure, unspecified: Secondary | ICD-10-CM | POA: Diagnosis not present

## 2018-01-12 DIAGNOSIS — R531 Weakness: Secondary | ICD-10-CM | POA: Diagnosis not present

## 2018-01-12 DIAGNOSIS — I10 Essential (primary) hypertension: Secondary | ICD-10-CM | POA: Diagnosis not present

## 2018-01-12 DIAGNOSIS — J9601 Acute respiratory failure with hypoxia: Secondary | ICD-10-CM | POA: Diagnosis not present

## 2018-01-12 DIAGNOSIS — Z7401 Bed confinement status: Secondary | ICD-10-CM | POA: Diagnosis not present

## 2018-01-12 DIAGNOSIS — K7010 Alcoholic hepatitis without ascites: Secondary | ICD-10-CM | POA: Diagnosis not present

## 2018-01-12 DIAGNOSIS — Z79899 Other long term (current) drug therapy: Secondary | ICD-10-CM | POA: Diagnosis not present

## 2018-01-12 DIAGNOSIS — J449 Chronic obstructive pulmonary disease, unspecified: Secondary | ICD-10-CM | POA: Diagnosis not present

## 2018-01-12 DIAGNOSIS — B9689 Other specified bacterial agents as the cause of diseases classified elsewhere: Secondary | ICD-10-CM | POA: Diagnosis not present

## 2018-01-12 DIAGNOSIS — J441 Chronic obstructive pulmonary disease with (acute) exacerbation: Secondary | ICD-10-CM | POA: Diagnosis not present

## 2018-01-12 DIAGNOSIS — I110 Hypertensive heart disease with heart failure: Secondary | ICD-10-CM | POA: Diagnosis not present

## 2018-01-15 DIAGNOSIS — K7010 Alcoholic hepatitis without ascites: Secondary | ICD-10-CM | POA: Diagnosis not present

## 2018-01-15 DIAGNOSIS — J189 Pneumonia, unspecified organism: Secondary | ICD-10-CM | POA: Diagnosis not present

## 2018-01-15 DIAGNOSIS — R531 Weakness: Secondary | ICD-10-CM | POA: Diagnosis not present

## 2018-01-15 DIAGNOSIS — J441 Chronic obstructive pulmonary disease with (acute) exacerbation: Secondary | ICD-10-CM | POA: Diagnosis not present

## 2018-01-15 DIAGNOSIS — F419 Anxiety disorder, unspecified: Secondary | ICD-10-CM | POA: Diagnosis not present

## 2018-01-15 DIAGNOSIS — J440 Chronic obstructive pulmonary disease with acute lower respiratory infection: Secondary | ICD-10-CM | POA: Diagnosis not present

## 2018-01-15 DIAGNOSIS — J449 Chronic obstructive pulmonary disease, unspecified: Secondary | ICD-10-CM | POA: Diagnosis not present

## 2018-01-15 DIAGNOSIS — F1020 Alcohol dependence, uncomplicated: Secondary | ICD-10-CM | POA: Diagnosis not present

## 2018-01-15 DIAGNOSIS — M2550 Pain in unspecified joint: Secondary | ICD-10-CM | POA: Diagnosis not present

## 2018-01-15 DIAGNOSIS — I10 Essential (primary) hypertension: Secondary | ICD-10-CM | POA: Diagnosis not present

## 2018-01-15 DIAGNOSIS — I110 Hypertensive heart disease with heart failure: Secondary | ICD-10-CM | POA: Diagnosis not present

## 2018-01-15 DIAGNOSIS — D649 Anemia, unspecified: Secondary | ICD-10-CM | POA: Diagnosis not present

## 2018-01-15 DIAGNOSIS — I5032 Chronic diastolic (congestive) heart failure: Secondary | ICD-10-CM | POA: Diagnosis not present

## 2018-01-15 DIAGNOSIS — Z7401 Bed confinement status: Secondary | ICD-10-CM | POA: Diagnosis not present

## 2018-01-15 DIAGNOSIS — J9601 Acute respiratory failure with hypoxia: Secondary | ICD-10-CM | POA: Diagnosis not present

## 2018-01-15 DIAGNOSIS — E559 Vitamin D deficiency, unspecified: Secondary | ICD-10-CM | POA: Diagnosis not present

## 2018-01-15 DIAGNOSIS — I509 Heart failure, unspecified: Secondary | ICD-10-CM | POA: Diagnosis not present

## 2018-01-15 DIAGNOSIS — N390 Urinary tract infection, site not specified: Secondary | ICD-10-CM | POA: Diagnosis not present

## 2018-01-15 DIAGNOSIS — Z79899 Other long term (current) drug therapy: Secondary | ICD-10-CM | POA: Diagnosis not present

## 2018-01-16 ENCOUNTER — Other Ambulatory Visit: Payer: Self-pay | Admitting: *Deleted

## 2018-01-16 NOTE — Patient Outreach (Signed)
[facility]) Care Management  01/16/2018  KEMPER HOCHMAN 1950-11-25 [id number]   Telephone Screen  Referral Date: 01/14/18 Referral Source: [facility] Daily Census Referral  Referral Reason: [employer] Daily Census Insurance: [facility]   Outreach attempt # 1 No answer. [facility] RN CM was not able to leave a HIPAA compliant voicemail message because the only number listed for this pt in Epic has a mailbox that is "full"  Plan: [facility] RN CM sent an unsuccessful outreach letter and scheduled this patient for another call attempt within 4 business days  Kimberly L. Noelle Penner, RN, BSN, CCM [facility] Telephonic Care Management Care Coordinator Office number [phone] Mobile number [phone]  Main [facility] number [phone] Fax number [phone]

## 2018-01-19 ENCOUNTER — Ambulatory Visit: Payer: Self-pay | Admitting: *Deleted

## 2018-01-20 ENCOUNTER — Ambulatory Visit: Payer: Self-pay | Admitting: *Deleted

## 2018-02-06 ENCOUNTER — Other Ambulatory Visit: Payer: Self-pay | Admitting: *Deleted

## 2018-02-06 NOTE — Patient Outreach (Signed)
[facility]) Care Management  02/06/2018  [name] [name] 12/19/1950 [id number]   Telephone Screen  Referral Date: 01/14/18 Referral Source: [facility] Daily Census Referral  Referral Reason: [employer] Daily Census Insurance: [place] medicare   and Transition of care assessment completed after her 01/15/18 to 01/21/18 stay at [facility] care and rehab She had also been at [facility] in August 2019  She stated she had not wanted home health visits after this d/c   Outreach attempt # 2 successful   Patient is able to verify HIPAA Reviewed and addressed referral to [facility] with patient [name] [name] is noted to be coughing and reports she is doing better. She reports getting up sputum after her nebulizer treatment. She reports using her nebulizer q 6 hours  She voices she is aware of the COPD action plan.  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, retired Char[employer] fundraiser She has support of a  Friend, Engineer, site who assists with transportation She reports not making follow up appointment per friend schedule Cm discussed possible [facility] referral for alternative transportation resources to be offered but she stated "Not right now" and agreed to CM resending her CM contact information to call if needed   Conditions: COPD, HTN, depression, hepatitis, humeral shaft Fx, hyperlipidemia, anxiety, CHF, tobacco abuse,   DME:  nebulizer   Medications: denies concerns with taking medications as prescribed, affording medications, side effects of medications and questions about medications  Appointments: She reports not making follow up appointment per friend schedule Cm discussed possible [facility] referral for alternative transportation resources to be offered but she stated "Not right now" and agreed to CM resending her CM contact information to call if needed  She confirms  she only sees Dr Samuel Germany and does not have pulmonologist per her preference   Advance Directives:She states she has a POA   Consent: [facility] RN CM reviewed [facility] services with patient. Patient gave verbal consent for services. She denies need of services from [facility] Community/Telephonic RN CM, pharmacy, health coach, NP or SW at this time  Plan: [facility] RN CM will close case at this time as patient has been assessed and no needs identified.   [facility] RN CM sent a successful outreach letter as discussed with [facility] brochure enclosed for review  Pt encouraged to return a call to [facility] RN CM prn  [name] Gehrig L. Noelle Penner, RN, BSN, CCM [facility] Telephonic Care Management Care Coordinator Office number [phone] Mobile number [phone]  Main [facility] number [phone] Fax number [phone]

## 2018-02-14 DIAGNOSIS — J441 Chronic obstructive pulmonary disease with (acute) exacerbation: Secondary | ICD-10-CM | POA: Diagnosis not present

## 2018-02-14 DIAGNOSIS — I5032 Chronic diastolic (congestive) heart failure: Secondary | ICD-10-CM | POA: Diagnosis not present

## 2018-02-14 DIAGNOSIS — J9601 Acute respiratory failure with hypoxia: Secondary | ICD-10-CM | POA: Diagnosis not present

## 2018-02-14 DIAGNOSIS — R262 Difficulty in walking, not elsewhere classified: Secondary | ICD-10-CM | POA: Diagnosis not present

## 2018-02-27 DIAGNOSIS — I10 Essential (primary) hypertension: Secondary | ICD-10-CM

## 2018-02-27 DIAGNOSIS — R069 Unspecified abnormalities of breathing: Secondary | ICD-10-CM | POA: Diagnosis not present

## 2018-02-27 DIAGNOSIS — F17210 Nicotine dependence, cigarettes, uncomplicated: Secondary | ICD-10-CM | POA: Diagnosis not present

## 2018-02-27 DIAGNOSIS — F10920 Alcohol use, unspecified with intoxication, uncomplicated: Secondary | ICD-10-CM | POA: Diagnosis not present

## 2018-02-27 DIAGNOSIS — F322 Major depressive disorder, single episode, severe without psychotic features: Secondary | ICD-10-CM | POA: Diagnosis not present

## 2018-02-27 DIAGNOSIS — R0602 Shortness of breath: Secondary | ICD-10-CM | POA: Diagnosis not present

## 2018-02-27 DIAGNOSIS — F329 Major depressive disorder, single episode, unspecified: Secondary | ICD-10-CM | POA: Diagnosis not present

## 2018-02-27 DIAGNOSIS — Z79899 Other long term (current) drug therapy: Secondary | ICD-10-CM | POA: Diagnosis not present

## 2018-02-27 DIAGNOSIS — R000 Tachycardia, unspecified: Secondary | ICD-10-CM | POA: Diagnosis not present

## 2018-02-27 DIAGNOSIS — J441 Chronic obstructive pulmonary disease with (acute) exacerbation: Secondary | ICD-10-CM

## 2018-02-27 DIAGNOSIS — F10129 Alcohol abuse with intoxication, unspecified: Secondary | ICD-10-CM | POA: Diagnosis not present

## 2018-02-27 DIAGNOSIS — R0902 Hypoxemia: Secondary | ICD-10-CM | POA: Diagnosis not present

## 2018-02-27 DIAGNOSIS — J9621 Acute and chronic respiratory failure with hypoxia: Secondary | ICD-10-CM | POA: Diagnosis not present

## 2018-02-27 DIAGNOSIS — J9601 Acute respiratory failure with hypoxia: Secondary | ICD-10-CM | POA: Diagnosis not present

## 2018-02-27 DIAGNOSIS — F29 Unspecified psychosis not due to a substance or known physiological condition: Secondary | ICD-10-CM | POA: Diagnosis not present

## 2018-02-27 DIAGNOSIS — F10929 Alcohol use, unspecified with intoxication, unspecified: Secondary | ICD-10-CM

## 2018-02-28 DIAGNOSIS — F10929 Alcohol use, unspecified with intoxication, unspecified: Secondary | ICD-10-CM | POA: Diagnosis not present

## 2018-02-28 DIAGNOSIS — J441 Chronic obstructive pulmonary disease with (acute) exacerbation: Secondary | ICD-10-CM | POA: Diagnosis not present

## 2018-02-28 DIAGNOSIS — I10 Essential (primary) hypertension: Secondary | ICD-10-CM | POA: Diagnosis not present

## 2018-03-16 DIAGNOSIS — J441 Chronic obstructive pulmonary disease with (acute) exacerbation: Secondary | ICD-10-CM | POA: Diagnosis not present

## 2018-03-16 DIAGNOSIS — R262 Difficulty in walking, not elsewhere classified: Secondary | ICD-10-CM | POA: Diagnosis not present

## 2018-03-16 DIAGNOSIS — J9601 Acute respiratory failure with hypoxia: Secondary | ICD-10-CM | POA: Diagnosis not present

## 2018-03-16 DIAGNOSIS — I5032 Chronic diastolic (congestive) heart failure: Secondary | ICD-10-CM | POA: Diagnosis not present

## 2018-04-16 DIAGNOSIS — R262 Difficulty in walking, not elsewhere classified: Secondary | ICD-10-CM | POA: Diagnosis not present

## 2018-04-16 DIAGNOSIS — J441 Chronic obstructive pulmonary disease with (acute) exacerbation: Secondary | ICD-10-CM | POA: Diagnosis not present

## 2018-04-16 DIAGNOSIS — J9601 Acute respiratory failure with hypoxia: Secondary | ICD-10-CM | POA: Diagnosis not present

## 2018-04-16 DIAGNOSIS — I5032 Chronic diastolic (congestive) heart failure: Secondary | ICD-10-CM | POA: Diagnosis not present

## 2018-05-17 DIAGNOSIS — J441 Chronic obstructive pulmonary disease with (acute) exacerbation: Secondary | ICD-10-CM | POA: Diagnosis not present

## 2018-05-17 DIAGNOSIS — J9601 Acute respiratory failure with hypoxia: Secondary | ICD-10-CM | POA: Diagnosis not present

## 2018-05-17 DIAGNOSIS — I5032 Chronic diastolic (congestive) heart failure: Secondary | ICD-10-CM | POA: Diagnosis not present

## 2018-05-17 DIAGNOSIS — R262 Difficulty in walking, not elsewhere classified: Secondary | ICD-10-CM | POA: Diagnosis not present

## 2018-06-15 DIAGNOSIS — I5032 Chronic diastolic (congestive) heart failure: Secondary | ICD-10-CM | POA: Diagnosis not present

## 2018-06-15 DIAGNOSIS — J9601 Acute respiratory failure with hypoxia: Secondary | ICD-10-CM | POA: Diagnosis not present

## 2018-06-15 DIAGNOSIS — R262 Difficulty in walking, not elsewhere classified: Secondary | ICD-10-CM | POA: Diagnosis not present

## 2018-06-15 DIAGNOSIS — J441 Chronic obstructive pulmonary disease with (acute) exacerbation: Secondary | ICD-10-CM | POA: Diagnosis not present

## 2018-07-16 DIAGNOSIS — J9601 Acute respiratory failure with hypoxia: Secondary | ICD-10-CM | POA: Diagnosis not present

## 2018-07-16 DIAGNOSIS — I5032 Chronic diastolic (congestive) heart failure: Secondary | ICD-10-CM | POA: Diagnosis not present

## 2018-07-16 DIAGNOSIS — R262 Difficulty in walking, not elsewhere classified: Secondary | ICD-10-CM | POA: Diagnosis not present

## 2018-07-16 DIAGNOSIS — J441 Chronic obstructive pulmonary disease with (acute) exacerbation: Secondary | ICD-10-CM | POA: Diagnosis not present

## 2018-08-15 DIAGNOSIS — R262 Difficulty in walking, not elsewhere classified: Secondary | ICD-10-CM | POA: Diagnosis not present

## 2018-08-15 DIAGNOSIS — J9601 Acute respiratory failure with hypoxia: Secondary | ICD-10-CM | POA: Diagnosis not present

## 2018-08-15 DIAGNOSIS — I5032 Chronic diastolic (congestive) heart failure: Secondary | ICD-10-CM | POA: Diagnosis not present

## 2018-08-15 DIAGNOSIS — J441 Chronic obstructive pulmonary disease with (acute) exacerbation: Secondary | ICD-10-CM | POA: Diagnosis not present

## 2018-12-29 DIAGNOSIS — I5032 Chronic diastolic (congestive) heart failure: Secondary | ICD-10-CM | POA: Diagnosis not present

## 2018-12-29 DIAGNOSIS — Z716 Tobacco abuse counseling: Secondary | ICD-10-CM | POA: Diagnosis not present

## 2018-12-29 DIAGNOSIS — Z79899 Other long term (current) drug therapy: Secondary | ICD-10-CM | POA: Diagnosis not present

## 2018-12-29 DIAGNOSIS — I10 Essential (primary) hypertension: Secondary | ICD-10-CM | POA: Diagnosis not present

## 2018-12-29 DIAGNOSIS — B1920 Unspecified viral hepatitis C without hepatic coma: Secondary | ICD-10-CM | POA: Diagnosis not present

## 2018-12-29 DIAGNOSIS — J449 Chronic obstructive pulmonary disease, unspecified: Secondary | ICD-10-CM | POA: Diagnosis not present

## 2018-12-29 DIAGNOSIS — R29898 Other symptoms and signs involving the musculoskeletal system: Secondary | ICD-10-CM | POA: Diagnosis not present

## 2018-12-29 DIAGNOSIS — Z9181 History of falling: Secondary | ICD-10-CM | POA: Diagnosis not present

## 2020-01-17 DIAGNOSIS — J449 Chronic obstructive pulmonary disease, unspecified: Secondary | ICD-10-CM | POA: Diagnosis not present

## 2020-01-17 DIAGNOSIS — I10 Essential (primary) hypertension: Secondary | ICD-10-CM | POA: Diagnosis not present

## 2020-01-17 DIAGNOSIS — B1920 Unspecified viral hepatitis C without hepatic coma: Secondary | ICD-10-CM | POA: Diagnosis not present

## 2020-01-17 DIAGNOSIS — I5032 Chronic diastolic (congestive) heart failure: Secondary | ICD-10-CM | POA: Diagnosis not present

## 2020-10-31 DIAGNOSIS — B1920 Unspecified viral hepatitis C without hepatic coma: Secondary | ICD-10-CM | POA: Diagnosis not present

## 2020-10-31 DIAGNOSIS — J449 Chronic obstructive pulmonary disease, unspecified: Secondary | ICD-10-CM | POA: Diagnosis not present

## 2020-10-31 DIAGNOSIS — I5032 Chronic diastolic (congestive) heart failure: Secondary | ICD-10-CM | POA: Diagnosis not present

## 2021-06-20 ENCOUNTER — Encounter: Payer: Self-pay | Admitting: Cardiology

## 2021-06-20 DIAGNOSIS — J9611 Chronic respiratory failure with hypoxia: Secondary | ICD-10-CM | POA: Diagnosis not present

## 2021-06-20 DIAGNOSIS — J439 Emphysema, unspecified: Secondary | ICD-10-CM | POA: Diagnosis not present

## 2021-06-20 DIAGNOSIS — J90 Pleural effusion, not elsewhere classified: Secondary | ICD-10-CM | POA: Diagnosis not present

## 2021-06-20 DIAGNOSIS — I5033 Acute on chronic diastolic (congestive) heart failure: Secondary | ICD-10-CM | POA: Diagnosis not present

## 2021-06-20 DIAGNOSIS — Z23 Encounter for immunization: Secondary | ICD-10-CM | POA: Diagnosis not present

## 2021-06-20 DIAGNOSIS — Z7401 Bed confinement status: Secondary | ICD-10-CM | POA: Diagnosis not present

## 2021-06-20 DIAGNOSIS — R059 Cough, unspecified: Secondary | ICD-10-CM | POA: Diagnosis not present

## 2021-06-20 DIAGNOSIS — M1990 Unspecified osteoarthritis, unspecified site: Secondary | ICD-10-CM | POA: Diagnosis not present

## 2021-06-20 DIAGNOSIS — Z79899 Other long term (current) drug therapy: Secondary | ICD-10-CM | POA: Diagnosis not present

## 2021-06-20 DIAGNOSIS — R509 Fever, unspecified: Secondary | ICD-10-CM | POA: Diagnosis not present

## 2021-06-20 DIAGNOSIS — J441 Chronic obstructive pulmonary disease with (acute) exacerbation: Secondary | ICD-10-CM | POA: Diagnosis not present

## 2021-06-20 DIAGNOSIS — R0602 Shortness of breath: Secondary | ICD-10-CM | POA: Diagnosis not present

## 2021-06-20 DIAGNOSIS — R000 Tachycardia, unspecified: Secondary | ICD-10-CM | POA: Diagnosis not present

## 2021-06-20 DIAGNOSIS — R0689 Other abnormalities of breathing: Secondary | ICD-10-CM | POA: Diagnosis not present

## 2021-06-20 DIAGNOSIS — F17210 Nicotine dependence, cigarettes, uncomplicated: Secondary | ICD-10-CM | POA: Diagnosis not present

## 2021-06-20 DIAGNOSIS — F419 Anxiety disorder, unspecified: Secondary | ICD-10-CM | POA: Diagnosis not present

## 2021-06-20 DIAGNOSIS — R0902 Hypoxemia: Secondary | ICD-10-CM | POA: Diagnosis not present

## 2021-06-20 DIAGNOSIS — E7800 Pure hypercholesterolemia, unspecified: Secondary | ICD-10-CM | POA: Diagnosis not present

## 2021-06-20 DIAGNOSIS — I959 Hypotension, unspecified: Secondary | ICD-10-CM | POA: Diagnosis not present

## 2021-06-27 DIAGNOSIS — I472 Ventricular tachycardia, unspecified: Secondary | ICD-10-CM

## 2021-06-27 DIAGNOSIS — N390 Urinary tract infection, site not specified: Secondary | ICD-10-CM | POA: Diagnosis not present

## 2021-06-27 DIAGNOSIS — J441 Chronic obstructive pulmonary disease with (acute) exacerbation: Secondary | ICD-10-CM | POA: Diagnosis not present

## 2021-06-27 DIAGNOSIS — J9691 Respiratory failure, unspecified with hypoxia: Secondary | ICD-10-CM | POA: Diagnosis not present

## 2021-06-27 DIAGNOSIS — J189 Pneumonia, unspecified organism: Secondary | ICD-10-CM | POA: Diagnosis not present

## 2021-06-27 DIAGNOSIS — R000 Tachycardia, unspecified: Secondary | ICD-10-CM | POA: Diagnosis not present

## 2021-06-27 DIAGNOSIS — I700 Atherosclerosis of aorta: Secondary | ICD-10-CM | POA: Diagnosis not present

## 2021-06-27 DIAGNOSIS — R0602 Shortness of breath: Secondary | ICD-10-CM | POA: Diagnosis not present

## 2021-06-27 DIAGNOSIS — R609 Edema, unspecified: Secondary | ICD-10-CM | POA: Diagnosis not present

## 2021-06-27 DIAGNOSIS — J80 Acute respiratory distress syndrome: Secondary | ICD-10-CM | POA: Diagnosis not present

## 2021-06-27 DIAGNOSIS — R062 Wheezing: Secondary | ICD-10-CM | POA: Diagnosis not present

## 2021-06-27 DIAGNOSIS — J811 Chronic pulmonary edema: Secondary | ICD-10-CM | POA: Diagnosis not present

## 2021-06-27 DIAGNOSIS — R601 Generalized edema: Secondary | ICD-10-CM | POA: Diagnosis not present

## 2021-06-27 DIAGNOSIS — I509 Heart failure, unspecified: Secondary | ICD-10-CM | POA: Diagnosis not present

## 2021-06-28 DIAGNOSIS — J189 Pneumonia, unspecified organism: Secondary | ICD-10-CM | POA: Diagnosis not present

## 2021-06-28 DIAGNOSIS — J441 Chronic obstructive pulmonary disease with (acute) exacerbation: Secondary | ICD-10-CM | POA: Diagnosis not present

## 2021-06-28 DIAGNOSIS — N390 Urinary tract infection, site not specified: Secondary | ICD-10-CM | POA: Diagnosis not present

## 2021-06-28 DIAGNOSIS — J9691 Respiratory failure, unspecified with hypoxia: Secondary | ICD-10-CM | POA: Diagnosis not present

## 2021-06-29 DIAGNOSIS — N390 Urinary tract infection, site not specified: Secondary | ICD-10-CM | POA: Diagnosis not present

## 2021-06-29 DIAGNOSIS — J441 Chronic obstructive pulmonary disease with (acute) exacerbation: Secondary | ICD-10-CM | POA: Diagnosis not present

## 2021-06-29 DIAGNOSIS — J9691 Respiratory failure, unspecified with hypoxia: Secondary | ICD-10-CM | POA: Diagnosis not present

## 2021-06-29 DIAGNOSIS — J189 Pneumonia, unspecified organism: Secondary | ICD-10-CM | POA: Diagnosis not present

## 2021-06-30 DIAGNOSIS — E7800 Pure hypercholesterolemia, unspecified: Secondary | ICD-10-CM | POA: Diagnosis not present

## 2021-06-30 DIAGNOSIS — Z23 Encounter for immunization: Secondary | ICD-10-CM | POA: Diagnosis not present

## 2021-06-30 DIAGNOSIS — I959 Hypotension, unspecified: Secondary | ICD-10-CM | POA: Diagnosis not present

## 2021-06-30 DIAGNOSIS — I110 Hypertensive heart disease with heart failure: Secondary | ICD-10-CM | POA: Diagnosis not present

## 2021-06-30 DIAGNOSIS — J9601 Acute respiratory failure with hypoxia: Secondary | ICD-10-CM | POA: Diagnosis not present

## 2021-06-30 DIAGNOSIS — N390 Urinary tract infection, site not specified: Secondary | ICD-10-CM | POA: Diagnosis not present

## 2021-06-30 DIAGNOSIS — I10 Essential (primary) hypertension: Secondary | ICD-10-CM | POA: Diagnosis not present

## 2021-06-30 DIAGNOSIS — E871 Hypo-osmolality and hyponatremia: Secondary | ICD-10-CM | POA: Diagnosis not present

## 2021-06-30 DIAGNOSIS — F419 Anxiety disorder, unspecified: Secondary | ICD-10-CM | POA: Diagnosis not present

## 2021-06-30 DIAGNOSIS — F17210 Nicotine dependence, cigarettes, uncomplicated: Secondary | ICD-10-CM | POA: Diagnosis not present

## 2021-06-30 DIAGNOSIS — R609 Edema, unspecified: Secondary | ICD-10-CM | POA: Diagnosis not present

## 2021-06-30 DIAGNOSIS — B1920 Unspecified viral hepatitis C without hepatic coma: Secondary | ICD-10-CM | POA: Diagnosis not present

## 2021-06-30 DIAGNOSIS — R2681 Unsteadiness on feet: Secondary | ICD-10-CM | POA: Diagnosis not present

## 2021-06-30 DIAGNOSIS — J189 Pneumonia, unspecified organism: Secondary | ICD-10-CM | POA: Diagnosis not present

## 2021-06-30 DIAGNOSIS — I5032 Chronic diastolic (congestive) heart failure: Secondary | ICD-10-CM | POA: Diagnosis not present

## 2021-06-30 DIAGNOSIS — M6281 Muscle weakness (generalized): Secondary | ICD-10-CM | POA: Diagnosis not present

## 2021-06-30 DIAGNOSIS — J9691 Respiratory failure, unspecified with hypoxia: Secondary | ICD-10-CM | POA: Diagnosis not present

## 2021-06-30 DIAGNOSIS — J449 Chronic obstructive pulmonary disease, unspecified: Secondary | ICD-10-CM | POA: Diagnosis not present

## 2021-06-30 DIAGNOSIS — Z7401 Bed confinement status: Secondary | ICD-10-CM | POA: Diagnosis not present

## 2021-06-30 DIAGNOSIS — R531 Weakness: Secondary | ICD-10-CM | POA: Diagnosis not present

## 2021-06-30 DIAGNOSIS — Z79899 Other long term (current) drug therapy: Secondary | ICD-10-CM | POA: Diagnosis not present

## 2021-06-30 DIAGNOSIS — I5033 Acute on chronic diastolic (congestive) heart failure: Secondary | ICD-10-CM | POA: Diagnosis not present

## 2021-06-30 DIAGNOSIS — J441 Chronic obstructive pulmonary disease with (acute) exacerbation: Secondary | ICD-10-CM | POA: Diagnosis not present

## 2021-07-03 DIAGNOSIS — E74819 Disorders of glucose transport, unspecified: Secondary | ICD-10-CM | POA: Diagnosis not present

## 2021-07-03 DIAGNOSIS — E559 Vitamin D deficiency, unspecified: Secondary | ICD-10-CM | POA: Diagnosis not present

## 2021-07-03 DIAGNOSIS — R609 Edema, unspecified: Secondary | ICD-10-CM | POA: Diagnosis not present

## 2021-07-03 DIAGNOSIS — Z79899 Other long term (current) drug therapy: Secondary | ICD-10-CM | POA: Diagnosis not present

## 2021-07-03 DIAGNOSIS — Z720 Tobacco use: Secondary | ICD-10-CM | POA: Diagnosis not present

## 2021-07-03 DIAGNOSIS — J9610 Chronic respiratory failure, unspecified whether with hypoxia or hypercapnia: Secondary | ICD-10-CM | POA: Diagnosis not present

## 2021-07-03 DIAGNOSIS — R531 Weakness: Secondary | ICD-10-CM | POA: Diagnosis not present

## 2021-07-03 DIAGNOSIS — M1990 Unspecified osteoarthritis, unspecified site: Secondary | ICD-10-CM | POA: Diagnosis not present

## 2021-07-03 DIAGNOSIS — J9691 Respiratory failure, unspecified with hypoxia: Secondary | ICD-10-CM | POA: Diagnosis not present

## 2021-07-03 DIAGNOSIS — Z7401 Bed confinement status: Secondary | ICD-10-CM | POA: Diagnosis not present

## 2021-07-03 DIAGNOSIS — I10 Essential (primary) hypertension: Secondary | ICD-10-CM | POA: Diagnosis not present

## 2021-07-03 DIAGNOSIS — I5032 Chronic diastolic (congestive) heart failure: Secondary | ICD-10-CM | POA: Diagnosis not present

## 2021-07-03 DIAGNOSIS — I4891 Unspecified atrial fibrillation: Secondary | ICD-10-CM | POA: Diagnosis not present

## 2021-07-03 DIAGNOSIS — B1920 Unspecified viral hepatitis C without hepatic coma: Secondary | ICD-10-CM | POA: Diagnosis not present

## 2021-07-03 DIAGNOSIS — N390 Urinary tract infection, site not specified: Secondary | ICD-10-CM | POA: Diagnosis not present

## 2021-07-03 DIAGNOSIS — R2681 Unsteadiness on feet: Secondary | ICD-10-CM | POA: Diagnosis not present

## 2021-07-03 DIAGNOSIS — E785 Hyperlipidemia, unspecified: Secondary | ICD-10-CM | POA: Diagnosis not present

## 2021-07-03 DIAGNOSIS — J441 Chronic obstructive pulmonary disease with (acute) exacerbation: Secondary | ICD-10-CM | POA: Diagnosis not present

## 2021-07-03 DIAGNOSIS — J9601 Acute respiratory failure with hypoxia: Secondary | ICD-10-CM | POA: Diagnosis not present

## 2021-07-03 DIAGNOSIS — Z23 Encounter for immunization: Secondary | ICD-10-CM | POA: Diagnosis not present

## 2021-07-03 DIAGNOSIS — E871 Hypo-osmolality and hyponatremia: Secondary | ICD-10-CM | POA: Diagnosis not present

## 2021-07-03 DIAGNOSIS — J449 Chronic obstructive pulmonary disease, unspecified: Secondary | ICD-10-CM | POA: Diagnosis not present

## 2021-07-03 DIAGNOSIS — J189 Pneumonia, unspecified organism: Secondary | ICD-10-CM | POA: Diagnosis not present

## 2021-07-03 DIAGNOSIS — M6281 Muscle weakness (generalized): Secondary | ICD-10-CM | POA: Diagnosis not present

## 2021-07-03 DIAGNOSIS — D519 Vitamin B12 deficiency anemia, unspecified: Secondary | ICD-10-CM | POA: Diagnosis not present

## 2021-07-05 DIAGNOSIS — Z720 Tobacco use: Secondary | ICD-10-CM | POA: Diagnosis not present

## 2021-07-05 DIAGNOSIS — M1990 Unspecified osteoarthritis, unspecified site: Secondary | ICD-10-CM | POA: Diagnosis not present

## 2021-07-05 DIAGNOSIS — I5032 Chronic diastolic (congestive) heart failure: Secondary | ICD-10-CM | POA: Diagnosis not present

## 2021-07-05 DIAGNOSIS — E871 Hypo-osmolality and hyponatremia: Secondary | ICD-10-CM | POA: Diagnosis not present

## 2021-07-05 DIAGNOSIS — J441 Chronic obstructive pulmonary disease with (acute) exacerbation: Secondary | ICD-10-CM | POA: Diagnosis not present

## 2021-07-05 DIAGNOSIS — J9610 Chronic respiratory failure, unspecified whether with hypoxia or hypercapnia: Secondary | ICD-10-CM | POA: Diagnosis not present

## 2021-07-05 DIAGNOSIS — I10 Essential (primary) hypertension: Secondary | ICD-10-CM | POA: Diagnosis not present

## 2021-07-05 DIAGNOSIS — J449 Chronic obstructive pulmonary disease, unspecified: Secondary | ICD-10-CM | POA: Diagnosis not present

## 2021-07-12 DIAGNOSIS — E871 Hypo-osmolality and hyponatremia: Secondary | ICD-10-CM | POA: Diagnosis not present

## 2021-07-12 DIAGNOSIS — I5032 Chronic diastolic (congestive) heart failure: Secondary | ICD-10-CM | POA: Diagnosis not present

## 2021-07-12 DIAGNOSIS — E785 Hyperlipidemia, unspecified: Secondary | ICD-10-CM | POA: Diagnosis not present

## 2021-07-12 DIAGNOSIS — M1990 Unspecified osteoarthritis, unspecified site: Secondary | ICD-10-CM | POA: Diagnosis not present

## 2021-07-12 DIAGNOSIS — I10 Essential (primary) hypertension: Secondary | ICD-10-CM | POA: Diagnosis not present

## 2021-07-12 DIAGNOSIS — J441 Chronic obstructive pulmonary disease with (acute) exacerbation: Secondary | ICD-10-CM | POA: Diagnosis not present

## 2021-07-12 DIAGNOSIS — J9610 Chronic respiratory failure, unspecified whether with hypoxia or hypercapnia: Secondary | ICD-10-CM | POA: Diagnosis not present

## 2021-07-16 ENCOUNTER — Other Ambulatory Visit: Payer: Self-pay

## 2021-07-16 NOTE — Patient Outreach (Addendum)
[facility]) Care Management ? ?07/16/2021 ? ?[name] ?November 08, 1950 ?[id number] ? ? ?[employer] member referral from SNF. Request assigned to Enzo Montgomery, RN for transition of care follow up. ? ?Ina Homes ?[facility]-Care Management Assistant ?[phone]  ?

## 2021-07-16 NOTE — Patient Outreach (Signed)
[facility]) Care Management ? ?07/16/2021 ? ?[name] ?04/03/1951 ?ID:[id number] ? ? ? ? ?Transition of Care Referral ? ?Referral Date: 07/16/2021 ?Referral Source:Discharge Report ?Date of Discharge: 07/13/2021 ?Facility: [facility] ? ? ? ? ?Outreach attempt # 1 to patient. Spoke with patient. She requested call be kept short and brief. She states she is doing well since returning home and denies any acute issues or concerns. She goes for PCP follow up appt tomorrow. She confirms she has all her meds and no issus with them. Patient reports breathing is WNL for her and states he know s what to do if SOB/exacerbation arises. She was no interested in ongoing TOC calls. Provided with contact info and advised to call if needed.  ? ? ? ?Plan: ?RN CM will close referral.  ? ? ?Enzo Montgomery, RN,BSN,CCM ?[facility] Care Management ?Telephonic Care Management Coordinator ?Direct Phone: [phone] ?Toll Free: [phone] ?Fax: [phone] ? ?

## 2021-07-17 DIAGNOSIS — R600 Localized edema: Secondary | ICD-10-CM | POA: Diagnosis not present

## 2021-07-17 DIAGNOSIS — J441 Chronic obstructive pulmonary disease with (acute) exacerbation: Secondary | ICD-10-CM | POA: Diagnosis not present

## 2021-07-17 DIAGNOSIS — F411 Generalized anxiety disorder: Secondary | ICD-10-CM | POA: Diagnosis not present

## 2021-07-17 DIAGNOSIS — Z79899 Other long term (current) drug therapy: Secondary | ICD-10-CM | POA: Diagnosis not present

## 2021-07-17 DIAGNOSIS — I480 Paroxysmal atrial fibrillation: Secondary | ICD-10-CM | POA: Diagnosis not present

## 2021-07-17 DIAGNOSIS — I5032 Chronic diastolic (congestive) heart failure: Secondary | ICD-10-CM | POA: Diagnosis not present

## 2021-07-17 DIAGNOSIS — Z6828 Body mass index (BMI) 28.0-28.9, adult: Secondary | ICD-10-CM | POA: Diagnosis not present

## 2021-07-17 DIAGNOSIS — E559 Vitamin D deficiency, unspecified: Secondary | ICD-10-CM | POA: Diagnosis not present

## 2021-08-21 DIAGNOSIS — R300 Dysuria: Secondary | ICD-10-CM | POA: Diagnosis not present

## 2021-08-21 DIAGNOSIS — N399 Disorder of urinary system, unspecified: Secondary | ICD-10-CM | POA: Diagnosis not present

## 2021-10-07 DIAGNOSIS — J9601 Acute respiratory failure with hypoxia: Secondary | ICD-10-CM | POA: Diagnosis not present

## 2021-10-07 DIAGNOSIS — E782 Mixed hyperlipidemia: Secondary | ICD-10-CM | POA: Diagnosis not present

## 2021-10-07 DIAGNOSIS — I110 Hypertensive heart disease with heart failure: Secondary | ICD-10-CM | POA: Diagnosis not present

## 2021-10-07 DIAGNOSIS — R0602 Shortness of breath: Secondary | ICD-10-CM | POA: Diagnosis not present

## 2021-10-07 DIAGNOSIS — I5032 Chronic diastolic (congestive) heart failure: Secondary | ICD-10-CM | POA: Diagnosis not present

## 2021-10-07 DIAGNOSIS — J449 Chronic obstructive pulmonary disease, unspecified: Secondary | ICD-10-CM | POA: Diagnosis not present

## 2021-10-07 DIAGNOSIS — R2681 Unsteadiness on feet: Secondary | ICD-10-CM | POA: Diagnosis not present

## 2021-10-07 DIAGNOSIS — I480 Paroxysmal atrial fibrillation: Secondary | ICD-10-CM | POA: Diagnosis not present

## 2021-10-07 DIAGNOSIS — Z23 Encounter for immunization: Secondary | ICD-10-CM | POA: Diagnosis not present

## 2021-10-07 DIAGNOSIS — M6281 Muscle weakness (generalized): Secondary | ICD-10-CM | POA: Diagnosis not present

## 2021-10-08 DIAGNOSIS — E559 Vitamin D deficiency, unspecified: Secondary | ICD-10-CM | POA: Diagnosis not present

## 2021-10-08 DIAGNOSIS — E119 Type 2 diabetes mellitus without complications: Secondary | ICD-10-CM | POA: Diagnosis not present

## 2021-10-09 DIAGNOSIS — Z760 Encounter for issue of repeat prescription: Secondary | ICD-10-CM | POA: Diagnosis not present

## 2021-10-11 DIAGNOSIS — I5032 Chronic diastolic (congestive) heart failure: Secondary | ICD-10-CM | POA: Diagnosis not present

## 2021-10-11 DIAGNOSIS — J9610 Chronic respiratory failure, unspecified whether with hypoxia or hypercapnia: Secondary | ICD-10-CM | POA: Diagnosis not present

## 2021-10-11 DIAGNOSIS — E871 Hypo-osmolality and hyponatremia: Secondary | ICD-10-CM | POA: Diagnosis not present

## 2021-10-11 DIAGNOSIS — E782 Mixed hyperlipidemia: Secondary | ICD-10-CM | POA: Diagnosis not present

## 2021-10-11 DIAGNOSIS — M6281 Muscle weakness (generalized): Secondary | ICD-10-CM | POA: Diagnosis not present

## 2021-10-11 DIAGNOSIS — I110 Hypertensive heart disease with heart failure: Secondary | ICD-10-CM | POA: Diagnosis not present

## 2021-10-11 DIAGNOSIS — I10 Essential (primary) hypertension: Secondary | ICD-10-CM | POA: Diagnosis not present

## 2021-10-11 DIAGNOSIS — J449 Chronic obstructive pulmonary disease, unspecified: Secondary | ICD-10-CM | POA: Diagnosis not present

## 2021-10-11 DIAGNOSIS — Z23 Encounter for immunization: Secondary | ICD-10-CM | POA: Diagnosis not present

## 2021-10-11 DIAGNOSIS — G4700 Insomnia, unspecified: Secondary | ICD-10-CM | POA: Diagnosis not present

## 2021-10-11 DIAGNOSIS — R2681 Unsteadiness on feet: Secondary | ICD-10-CM | POA: Diagnosis not present

## 2021-10-11 DIAGNOSIS — I480 Paroxysmal atrial fibrillation: Secondary | ICD-10-CM | POA: Diagnosis not present

## 2021-10-11 DIAGNOSIS — J9601 Acute respiratory failure with hypoxia: Secondary | ICD-10-CM | POA: Diagnosis not present

## 2021-10-11 DIAGNOSIS — M1990 Unspecified osteoarthritis, unspecified site: Secondary | ICD-10-CM | POA: Diagnosis not present

## 2021-10-12 DIAGNOSIS — Z23 Encounter for immunization: Secondary | ICD-10-CM | POA: Diagnosis not present

## 2021-10-12 DIAGNOSIS — E782 Mixed hyperlipidemia: Secondary | ICD-10-CM | POA: Diagnosis not present

## 2021-10-12 DIAGNOSIS — R2681 Unsteadiness on feet: Secondary | ICD-10-CM | POA: Diagnosis not present

## 2021-10-12 DIAGNOSIS — I5032 Chronic diastolic (congestive) heart failure: Secondary | ICD-10-CM | POA: Diagnosis not present

## 2021-10-12 DIAGNOSIS — I110 Hypertensive heart disease with heart failure: Secondary | ICD-10-CM | POA: Diagnosis not present

## 2021-10-12 DIAGNOSIS — J449 Chronic obstructive pulmonary disease, unspecified: Secondary | ICD-10-CM | POA: Diagnosis not present

## 2021-10-12 DIAGNOSIS — J9601 Acute respiratory failure with hypoxia: Secondary | ICD-10-CM | POA: Diagnosis not present

## 2021-10-12 DIAGNOSIS — M6281 Muscle weakness (generalized): Secondary | ICD-10-CM | POA: Diagnosis not present

## 2021-10-12 DIAGNOSIS — I480 Paroxysmal atrial fibrillation: Secondary | ICD-10-CM | POA: Diagnosis not present

## 2021-10-15 DIAGNOSIS — M6281 Muscle weakness (generalized): Secondary | ICD-10-CM | POA: Diagnosis not present

## 2021-10-15 DIAGNOSIS — E782 Mixed hyperlipidemia: Secondary | ICD-10-CM | POA: Diagnosis not present

## 2021-10-15 DIAGNOSIS — Z23 Encounter for immunization: Secondary | ICD-10-CM | POA: Diagnosis not present

## 2021-10-15 DIAGNOSIS — J9601 Acute respiratory failure with hypoxia: Secondary | ICD-10-CM | POA: Diagnosis not present

## 2021-10-15 DIAGNOSIS — I110 Hypertensive heart disease with heart failure: Secondary | ICD-10-CM | POA: Diagnosis not present

## 2021-10-15 DIAGNOSIS — J449 Chronic obstructive pulmonary disease, unspecified: Secondary | ICD-10-CM | POA: Diagnosis not present

## 2021-10-15 DIAGNOSIS — I5032 Chronic diastolic (congestive) heart failure: Secondary | ICD-10-CM | POA: Diagnosis not present

## 2021-10-15 DIAGNOSIS — R2681 Unsteadiness on feet: Secondary | ICD-10-CM | POA: Diagnosis not present

## 2021-10-15 DIAGNOSIS — I480 Paroxysmal atrial fibrillation: Secondary | ICD-10-CM | POA: Diagnosis not present

## 2021-10-16 DIAGNOSIS — I5032 Chronic diastolic (congestive) heart failure: Secondary | ICD-10-CM | POA: Diagnosis not present

## 2021-10-16 DIAGNOSIS — J449 Chronic obstructive pulmonary disease, unspecified: Secondary | ICD-10-CM | POA: Diagnosis not present

## 2021-10-16 DIAGNOSIS — J9601 Acute respiratory failure with hypoxia: Secondary | ICD-10-CM | POA: Diagnosis not present

## 2021-10-16 DIAGNOSIS — M6281 Muscle weakness (generalized): Secondary | ICD-10-CM | POA: Diagnosis not present

## 2021-10-16 DIAGNOSIS — I110 Hypertensive heart disease with heart failure: Secondary | ICD-10-CM | POA: Diagnosis not present

## 2021-10-16 DIAGNOSIS — E782 Mixed hyperlipidemia: Secondary | ICD-10-CM | POA: Diagnosis not present

## 2021-10-16 DIAGNOSIS — Z23 Encounter for immunization: Secondary | ICD-10-CM | POA: Diagnosis not present

## 2021-10-16 DIAGNOSIS — I480 Paroxysmal atrial fibrillation: Secondary | ICD-10-CM | POA: Diagnosis not present

## 2021-10-16 DIAGNOSIS — R2681 Unsteadiness on feet: Secondary | ICD-10-CM | POA: Diagnosis not present

## 2021-10-17 DIAGNOSIS — Z23 Encounter for immunization: Secondary | ICD-10-CM | POA: Diagnosis not present

## 2021-10-17 DIAGNOSIS — E782 Mixed hyperlipidemia: Secondary | ICD-10-CM | POA: Diagnosis not present

## 2021-10-17 DIAGNOSIS — M6281 Muscle weakness (generalized): Secondary | ICD-10-CM | POA: Diagnosis not present

## 2021-10-17 DIAGNOSIS — I5032 Chronic diastolic (congestive) heart failure: Secondary | ICD-10-CM | POA: Diagnosis not present

## 2021-10-17 DIAGNOSIS — J449 Chronic obstructive pulmonary disease, unspecified: Secondary | ICD-10-CM | POA: Diagnosis not present

## 2021-10-17 DIAGNOSIS — J9601 Acute respiratory failure with hypoxia: Secondary | ICD-10-CM | POA: Diagnosis not present

## 2021-10-17 DIAGNOSIS — I110 Hypertensive heart disease with heart failure: Secondary | ICD-10-CM | POA: Diagnosis not present

## 2021-10-17 DIAGNOSIS — I480 Paroxysmal atrial fibrillation: Secondary | ICD-10-CM | POA: Diagnosis not present

## 2021-10-17 DIAGNOSIS — R2681 Unsteadiness on feet: Secondary | ICD-10-CM | POA: Diagnosis not present

## 2021-10-21 DIAGNOSIS — I5032 Chronic diastolic (congestive) heart failure: Secondary | ICD-10-CM | POA: Diagnosis not present

## 2021-10-21 DIAGNOSIS — Z23 Encounter for immunization: Secondary | ICD-10-CM | POA: Diagnosis not present

## 2021-10-21 DIAGNOSIS — J449 Chronic obstructive pulmonary disease, unspecified: Secondary | ICD-10-CM | POA: Diagnosis not present

## 2021-10-21 DIAGNOSIS — I110 Hypertensive heart disease with heart failure: Secondary | ICD-10-CM | POA: Diagnosis not present

## 2021-10-21 DIAGNOSIS — I480 Paroxysmal atrial fibrillation: Secondary | ICD-10-CM | POA: Diagnosis not present

## 2021-10-21 DIAGNOSIS — E782 Mixed hyperlipidemia: Secondary | ICD-10-CM | POA: Diagnosis not present

## 2021-10-21 DIAGNOSIS — J9601 Acute respiratory failure with hypoxia: Secondary | ICD-10-CM | POA: Diagnosis not present

## 2021-10-21 DIAGNOSIS — R2681 Unsteadiness on feet: Secondary | ICD-10-CM | POA: Diagnosis not present

## 2021-10-21 DIAGNOSIS — M6281 Muscle weakness (generalized): Secondary | ICD-10-CM | POA: Diagnosis not present

## 2021-10-22 DIAGNOSIS — I110 Hypertensive heart disease with heart failure: Secondary | ICD-10-CM | POA: Diagnosis not present

## 2021-10-22 DIAGNOSIS — I5032 Chronic diastolic (congestive) heart failure: Secondary | ICD-10-CM | POA: Diagnosis not present

## 2021-10-22 DIAGNOSIS — Z23 Encounter for immunization: Secondary | ICD-10-CM | POA: Diagnosis not present

## 2021-10-22 DIAGNOSIS — J9601 Acute respiratory failure with hypoxia: Secondary | ICD-10-CM | POA: Diagnosis not present

## 2021-10-22 DIAGNOSIS — E782 Mixed hyperlipidemia: Secondary | ICD-10-CM | POA: Diagnosis not present

## 2021-10-22 DIAGNOSIS — J449 Chronic obstructive pulmonary disease, unspecified: Secondary | ICD-10-CM | POA: Diagnosis not present

## 2021-10-22 DIAGNOSIS — R2681 Unsteadiness on feet: Secondary | ICD-10-CM | POA: Diagnosis not present

## 2021-10-22 DIAGNOSIS — I480 Paroxysmal atrial fibrillation: Secondary | ICD-10-CM | POA: Diagnosis not present

## 2021-10-22 DIAGNOSIS — M6281 Muscle weakness (generalized): Secondary | ICD-10-CM | POA: Diagnosis not present

## 2021-10-24 DIAGNOSIS — I110 Hypertensive heart disease with heart failure: Secondary | ICD-10-CM | POA: Diagnosis not present

## 2021-10-24 DIAGNOSIS — R2681 Unsteadiness on feet: Secondary | ICD-10-CM | POA: Diagnosis not present

## 2021-10-24 DIAGNOSIS — J449 Chronic obstructive pulmonary disease, unspecified: Secondary | ICD-10-CM | POA: Diagnosis not present

## 2021-10-24 DIAGNOSIS — J9601 Acute respiratory failure with hypoxia: Secondary | ICD-10-CM | POA: Diagnosis not present

## 2021-10-24 DIAGNOSIS — Z23 Encounter for immunization: Secondary | ICD-10-CM | POA: Diagnosis not present

## 2021-10-24 DIAGNOSIS — I5032 Chronic diastolic (congestive) heart failure: Secondary | ICD-10-CM | POA: Diagnosis not present

## 2021-10-24 DIAGNOSIS — M6281 Muscle weakness (generalized): Secondary | ICD-10-CM | POA: Diagnosis not present

## 2021-10-24 DIAGNOSIS — E782 Mixed hyperlipidemia: Secondary | ICD-10-CM | POA: Diagnosis not present

## 2021-10-24 DIAGNOSIS — I480 Paroxysmal atrial fibrillation: Secondary | ICD-10-CM | POA: Diagnosis not present

## 2021-10-29 DIAGNOSIS — I110 Hypertensive heart disease with heart failure: Secondary | ICD-10-CM | POA: Diagnosis not present

## 2021-10-29 DIAGNOSIS — I5032 Chronic diastolic (congestive) heart failure: Secondary | ICD-10-CM | POA: Diagnosis not present

## 2021-10-29 DIAGNOSIS — Z23 Encounter for immunization: Secondary | ICD-10-CM | POA: Diagnosis not present

## 2021-10-29 DIAGNOSIS — J9601 Acute respiratory failure with hypoxia: Secondary | ICD-10-CM | POA: Diagnosis not present

## 2021-10-29 DIAGNOSIS — I480 Paroxysmal atrial fibrillation: Secondary | ICD-10-CM | POA: Diagnosis not present

## 2021-10-29 DIAGNOSIS — M6281 Muscle weakness (generalized): Secondary | ICD-10-CM | POA: Diagnosis not present

## 2021-10-29 DIAGNOSIS — J449 Chronic obstructive pulmonary disease, unspecified: Secondary | ICD-10-CM | POA: Diagnosis not present

## 2021-10-29 DIAGNOSIS — E782 Mixed hyperlipidemia: Secondary | ICD-10-CM | POA: Diagnosis not present

## 2021-10-29 DIAGNOSIS — R2681 Unsteadiness on feet: Secondary | ICD-10-CM | POA: Diagnosis not present

## 2021-10-30 DIAGNOSIS — Z23 Encounter for immunization: Secondary | ICD-10-CM | POA: Diagnosis not present

## 2021-10-30 DIAGNOSIS — M6281 Muscle weakness (generalized): Secondary | ICD-10-CM | POA: Diagnosis not present

## 2021-10-30 DIAGNOSIS — E782 Mixed hyperlipidemia: Secondary | ICD-10-CM | POA: Diagnosis not present

## 2021-10-30 DIAGNOSIS — J449 Chronic obstructive pulmonary disease, unspecified: Secondary | ICD-10-CM | POA: Diagnosis not present

## 2021-10-30 DIAGNOSIS — R2681 Unsteadiness on feet: Secondary | ICD-10-CM | POA: Diagnosis not present

## 2021-10-30 DIAGNOSIS — I110 Hypertensive heart disease with heart failure: Secondary | ICD-10-CM | POA: Diagnosis not present

## 2021-10-30 DIAGNOSIS — I5032 Chronic diastolic (congestive) heart failure: Secondary | ICD-10-CM | POA: Diagnosis not present

## 2021-10-30 DIAGNOSIS — I480 Paroxysmal atrial fibrillation: Secondary | ICD-10-CM | POA: Diagnosis not present

## 2021-10-30 DIAGNOSIS — J9601 Acute respiratory failure with hypoxia: Secondary | ICD-10-CM | POA: Diagnosis not present

## 2021-10-31 DIAGNOSIS — I110 Hypertensive heart disease with heart failure: Secondary | ICD-10-CM | POA: Diagnosis not present

## 2021-10-31 DIAGNOSIS — J449 Chronic obstructive pulmonary disease, unspecified: Secondary | ICD-10-CM | POA: Diagnosis not present

## 2021-10-31 DIAGNOSIS — M6281 Muscle weakness (generalized): Secondary | ICD-10-CM | POA: Diagnosis not present

## 2021-10-31 DIAGNOSIS — R2681 Unsteadiness on feet: Secondary | ICD-10-CM | POA: Diagnosis not present

## 2021-10-31 DIAGNOSIS — Z23 Encounter for immunization: Secondary | ICD-10-CM | POA: Diagnosis not present

## 2021-10-31 DIAGNOSIS — I480 Paroxysmal atrial fibrillation: Secondary | ICD-10-CM | POA: Diagnosis not present

## 2021-10-31 DIAGNOSIS — E782 Mixed hyperlipidemia: Secondary | ICD-10-CM | POA: Diagnosis not present

## 2021-10-31 DIAGNOSIS — J9601 Acute respiratory failure with hypoxia: Secondary | ICD-10-CM | POA: Diagnosis not present

## 2021-10-31 DIAGNOSIS — I5032 Chronic diastolic (congestive) heart failure: Secondary | ICD-10-CM | POA: Diagnosis not present

## 2021-11-02 DIAGNOSIS — M1389 Other specified arthritis, multiple sites: Secondary | ICD-10-CM | POA: Diagnosis not present

## 2021-11-06 DIAGNOSIS — R2681 Unsteadiness on feet: Secondary | ICD-10-CM | POA: Diagnosis not present

## 2021-11-06 DIAGNOSIS — I5032 Chronic diastolic (congestive) heart failure: Secondary | ICD-10-CM | POA: Diagnosis not present

## 2021-11-06 DIAGNOSIS — J449 Chronic obstructive pulmonary disease, unspecified: Secondary | ICD-10-CM | POA: Diagnosis not present

## 2021-11-06 DIAGNOSIS — J9601 Acute respiratory failure with hypoxia: Secondary | ICD-10-CM | POA: Diagnosis not present

## 2021-11-06 DIAGNOSIS — M6281 Muscle weakness (generalized): Secondary | ICD-10-CM | POA: Diagnosis not present

## 2021-11-06 DIAGNOSIS — R300 Dysuria: Secondary | ICD-10-CM | POA: Diagnosis not present

## 2021-11-06 DIAGNOSIS — E782 Mixed hyperlipidemia: Secondary | ICD-10-CM | POA: Diagnosis not present

## 2021-11-06 DIAGNOSIS — I480 Paroxysmal atrial fibrillation: Secondary | ICD-10-CM | POA: Diagnosis not present

## 2021-11-06 DIAGNOSIS — Z23 Encounter for immunization: Secondary | ICD-10-CM | POA: Diagnosis not present

## 2021-11-06 DIAGNOSIS — I110 Hypertensive heart disease with heart failure: Secondary | ICD-10-CM | POA: Diagnosis not present

## 2021-11-07 DIAGNOSIS — N390 Urinary tract infection, site not specified: Secondary | ICD-10-CM | POA: Diagnosis not present

## 2021-11-09 DIAGNOSIS — N390 Urinary tract infection, site not specified: Secondary | ICD-10-CM | POA: Diagnosis not present

## 2021-11-13 DIAGNOSIS — M6281 Muscle weakness (generalized): Secondary | ICD-10-CM | POA: Diagnosis not present

## 2021-11-13 DIAGNOSIS — J9601 Acute respiratory failure with hypoxia: Secondary | ICD-10-CM | POA: Diagnosis not present

## 2021-11-13 DIAGNOSIS — I480 Paroxysmal atrial fibrillation: Secondary | ICD-10-CM | POA: Diagnosis not present

## 2021-11-13 DIAGNOSIS — I5032 Chronic diastolic (congestive) heart failure: Secondary | ICD-10-CM | POA: Diagnosis not present

## 2021-11-13 DIAGNOSIS — I110 Hypertensive heart disease with heart failure: Secondary | ICD-10-CM | POA: Diagnosis not present

## 2021-11-13 DIAGNOSIS — J449 Chronic obstructive pulmonary disease, unspecified: Secondary | ICD-10-CM | POA: Diagnosis not present

## 2021-11-13 DIAGNOSIS — E782 Mixed hyperlipidemia: Secondary | ICD-10-CM | POA: Diagnosis not present

## 2021-11-13 DIAGNOSIS — R2681 Unsteadiness on feet: Secondary | ICD-10-CM | POA: Diagnosis not present

## 2021-11-13 DIAGNOSIS — Z23 Encounter for immunization: Secondary | ICD-10-CM | POA: Diagnosis not present

## 2021-11-14 DIAGNOSIS — R062 Wheezing: Secondary | ICD-10-CM | POA: Diagnosis not present

## 2021-11-14 DIAGNOSIS — J449 Chronic obstructive pulmonary disease, unspecified: Secondary | ICD-10-CM | POA: Diagnosis not present

## 2021-11-14 DIAGNOSIS — R0602 Shortness of breath: Secondary | ICD-10-CM | POA: Diagnosis not present

## 2021-11-14 DIAGNOSIS — I509 Heart failure, unspecified: Secondary | ICD-10-CM | POA: Diagnosis not present

## 2021-11-16 DIAGNOSIS — I110 Hypertensive heart disease with heart failure: Secondary | ICD-10-CM | POA: Diagnosis not present

## 2021-11-16 DIAGNOSIS — I5032 Chronic diastolic (congestive) heart failure: Secondary | ICD-10-CM | POA: Diagnosis not present

## 2021-11-16 DIAGNOSIS — J449 Chronic obstructive pulmonary disease, unspecified: Secondary | ICD-10-CM | POA: Diagnosis not present

## 2021-11-16 DIAGNOSIS — J9601 Acute respiratory failure with hypoxia: Secondary | ICD-10-CM | POA: Diagnosis not present

## 2021-11-16 DIAGNOSIS — R2681 Unsteadiness on feet: Secondary | ICD-10-CM | POA: Diagnosis not present

## 2021-11-16 DIAGNOSIS — M6281 Muscle weakness (generalized): Secondary | ICD-10-CM | POA: Diagnosis not present

## 2021-11-16 DIAGNOSIS — E782 Mixed hyperlipidemia: Secondary | ICD-10-CM | POA: Diagnosis not present

## 2021-11-16 DIAGNOSIS — Z23 Encounter for immunization: Secondary | ICD-10-CM | POA: Diagnosis not present

## 2021-11-16 DIAGNOSIS — I480 Paroxysmal atrial fibrillation: Secondary | ICD-10-CM | POA: Diagnosis not present

## 2021-11-19 DIAGNOSIS — J449 Chronic obstructive pulmonary disease, unspecified: Secondary | ICD-10-CM | POA: Diagnosis not present

## 2021-11-19 DIAGNOSIS — M6281 Muscle weakness (generalized): Secondary | ICD-10-CM | POA: Diagnosis not present

## 2021-11-19 DIAGNOSIS — I5032 Chronic diastolic (congestive) heart failure: Secondary | ICD-10-CM | POA: Diagnosis not present

## 2021-11-19 DIAGNOSIS — Z23 Encounter for immunization: Secondary | ICD-10-CM | POA: Diagnosis not present

## 2021-11-19 DIAGNOSIS — E782 Mixed hyperlipidemia: Secondary | ICD-10-CM | POA: Diagnosis not present

## 2021-11-19 DIAGNOSIS — I480 Paroxysmal atrial fibrillation: Secondary | ICD-10-CM | POA: Diagnosis not present

## 2021-11-19 DIAGNOSIS — I110 Hypertensive heart disease with heart failure: Secondary | ICD-10-CM | POA: Diagnosis not present

## 2021-11-19 DIAGNOSIS — J9601 Acute respiratory failure with hypoxia: Secondary | ICD-10-CM | POA: Diagnosis not present

## 2021-11-19 DIAGNOSIS — R2681 Unsteadiness on feet: Secondary | ICD-10-CM | POA: Diagnosis not present

## 2021-11-20 DIAGNOSIS — J9601 Acute respiratory failure with hypoxia: Secondary | ICD-10-CM | POA: Diagnosis not present

## 2021-11-20 DIAGNOSIS — I110 Hypertensive heart disease with heart failure: Secondary | ICD-10-CM | POA: Diagnosis not present

## 2021-11-20 DIAGNOSIS — I5032 Chronic diastolic (congestive) heart failure: Secondary | ICD-10-CM | POA: Diagnosis not present

## 2021-11-20 DIAGNOSIS — J449 Chronic obstructive pulmonary disease, unspecified: Secondary | ICD-10-CM | POA: Diagnosis not present

## 2021-11-20 DIAGNOSIS — E782 Mixed hyperlipidemia: Secondary | ICD-10-CM | POA: Diagnosis not present

## 2021-11-20 DIAGNOSIS — I480 Paroxysmal atrial fibrillation: Secondary | ICD-10-CM | POA: Diagnosis not present

## 2021-11-20 DIAGNOSIS — M6281 Muscle weakness (generalized): Secondary | ICD-10-CM | POA: Diagnosis not present

## 2021-11-20 DIAGNOSIS — Z23 Encounter for immunization: Secondary | ICD-10-CM | POA: Diagnosis not present

## 2021-11-20 DIAGNOSIS — R2681 Unsteadiness on feet: Secondary | ICD-10-CM | POA: Diagnosis not present

## 2021-11-21 DIAGNOSIS — E782 Mixed hyperlipidemia: Secondary | ICD-10-CM | POA: Diagnosis not present

## 2021-11-21 DIAGNOSIS — Z23 Encounter for immunization: Secondary | ICD-10-CM | POA: Diagnosis not present

## 2021-11-21 DIAGNOSIS — M6281 Muscle weakness (generalized): Secondary | ICD-10-CM | POA: Diagnosis not present

## 2021-11-21 DIAGNOSIS — I5032 Chronic diastolic (congestive) heart failure: Secondary | ICD-10-CM | POA: Diagnosis not present

## 2021-11-21 DIAGNOSIS — I110 Hypertensive heart disease with heart failure: Secondary | ICD-10-CM | POA: Diagnosis not present

## 2021-11-21 DIAGNOSIS — R2681 Unsteadiness on feet: Secondary | ICD-10-CM | POA: Diagnosis not present

## 2021-11-21 DIAGNOSIS — J449 Chronic obstructive pulmonary disease, unspecified: Secondary | ICD-10-CM | POA: Diagnosis not present

## 2021-11-21 DIAGNOSIS — J9601 Acute respiratory failure with hypoxia: Secondary | ICD-10-CM | POA: Diagnosis not present

## 2021-11-21 DIAGNOSIS — I480 Paroxysmal atrial fibrillation: Secondary | ICD-10-CM | POA: Diagnosis not present

## 2021-11-22 DIAGNOSIS — E782 Mixed hyperlipidemia: Secondary | ICD-10-CM | POA: Diagnosis not present

## 2021-11-22 DIAGNOSIS — J449 Chronic obstructive pulmonary disease, unspecified: Secondary | ICD-10-CM | POA: Diagnosis not present

## 2021-11-22 DIAGNOSIS — I5032 Chronic diastolic (congestive) heart failure: Secondary | ICD-10-CM | POA: Diagnosis not present

## 2021-11-22 DIAGNOSIS — J9601 Acute respiratory failure with hypoxia: Secondary | ICD-10-CM | POA: Diagnosis not present

## 2021-11-22 DIAGNOSIS — M6281 Muscle weakness (generalized): Secondary | ICD-10-CM | POA: Diagnosis not present

## 2021-11-22 DIAGNOSIS — Z23 Encounter for immunization: Secondary | ICD-10-CM | POA: Diagnosis not present

## 2021-11-22 DIAGNOSIS — R2681 Unsteadiness on feet: Secondary | ICD-10-CM | POA: Diagnosis not present

## 2021-11-22 DIAGNOSIS — I110 Hypertensive heart disease with heart failure: Secondary | ICD-10-CM | POA: Diagnosis not present

## 2021-11-22 DIAGNOSIS — I480 Paroxysmal atrial fibrillation: Secondary | ICD-10-CM | POA: Diagnosis not present

## 2021-11-28 DIAGNOSIS — M6281 Muscle weakness (generalized): Secondary | ICD-10-CM | POA: Diagnosis not present

## 2021-11-28 DIAGNOSIS — R2681 Unsteadiness on feet: Secondary | ICD-10-CM | POA: Diagnosis not present

## 2021-11-28 DIAGNOSIS — J9601 Acute respiratory failure with hypoxia: Secondary | ICD-10-CM | POA: Diagnosis not present

## 2021-11-28 DIAGNOSIS — E782 Mixed hyperlipidemia: Secondary | ICD-10-CM | POA: Diagnosis not present

## 2021-11-28 DIAGNOSIS — Z23 Encounter for immunization: Secondary | ICD-10-CM | POA: Diagnosis not present

## 2021-11-28 DIAGNOSIS — J449 Chronic obstructive pulmonary disease, unspecified: Secondary | ICD-10-CM | POA: Diagnosis not present

## 2021-11-28 DIAGNOSIS — I5032 Chronic diastolic (congestive) heart failure: Secondary | ICD-10-CM | POA: Diagnosis not present

## 2021-11-28 DIAGNOSIS — I110 Hypertensive heart disease with heart failure: Secondary | ICD-10-CM | POA: Diagnosis not present

## 2021-11-28 DIAGNOSIS — I480 Paroxysmal atrial fibrillation: Secondary | ICD-10-CM | POA: Diagnosis not present

## 2021-11-29 DIAGNOSIS — Z23 Encounter for immunization: Secondary | ICD-10-CM | POA: Diagnosis not present

## 2021-11-29 DIAGNOSIS — I5032 Chronic diastolic (congestive) heart failure: Secondary | ICD-10-CM | POA: Diagnosis not present

## 2021-11-29 DIAGNOSIS — I110 Hypertensive heart disease with heart failure: Secondary | ICD-10-CM | POA: Diagnosis not present

## 2021-11-29 DIAGNOSIS — E782 Mixed hyperlipidemia: Secondary | ICD-10-CM | POA: Diagnosis not present

## 2021-11-29 DIAGNOSIS — J9601 Acute respiratory failure with hypoxia: Secondary | ICD-10-CM | POA: Diagnosis not present

## 2021-11-29 DIAGNOSIS — J449 Chronic obstructive pulmonary disease, unspecified: Secondary | ICD-10-CM | POA: Diagnosis not present

## 2021-11-29 DIAGNOSIS — I480 Paroxysmal atrial fibrillation: Secondary | ICD-10-CM | POA: Diagnosis not present

## 2021-11-29 DIAGNOSIS — M6281 Muscle weakness (generalized): Secondary | ICD-10-CM | POA: Diagnosis not present

## 2021-11-29 DIAGNOSIS — R2681 Unsteadiness on feet: Secondary | ICD-10-CM | POA: Diagnosis not present

## 2021-12-03 DIAGNOSIS — I480 Paroxysmal atrial fibrillation: Secondary | ICD-10-CM | POA: Diagnosis not present

## 2021-12-03 DIAGNOSIS — I5032 Chronic diastolic (congestive) heart failure: Secondary | ICD-10-CM | POA: Diagnosis not present

## 2021-12-03 DIAGNOSIS — E782 Mixed hyperlipidemia: Secondary | ICD-10-CM | POA: Diagnosis not present

## 2021-12-03 DIAGNOSIS — M6281 Muscle weakness (generalized): Secondary | ICD-10-CM | POA: Diagnosis not present

## 2021-12-03 DIAGNOSIS — Z23 Encounter for immunization: Secondary | ICD-10-CM | POA: Diagnosis not present

## 2021-12-03 DIAGNOSIS — R2681 Unsteadiness on feet: Secondary | ICD-10-CM | POA: Diagnosis not present

## 2021-12-03 DIAGNOSIS — I110 Hypertensive heart disease with heart failure: Secondary | ICD-10-CM | POA: Diagnosis not present

## 2021-12-03 DIAGNOSIS — J9601 Acute respiratory failure with hypoxia: Secondary | ICD-10-CM | POA: Diagnosis not present

## 2021-12-03 DIAGNOSIS — J449 Chronic obstructive pulmonary disease, unspecified: Secondary | ICD-10-CM | POA: Diagnosis not present

## 2021-12-04 DIAGNOSIS — I480 Paroxysmal atrial fibrillation: Secondary | ICD-10-CM | POA: Diagnosis not present

## 2021-12-04 DIAGNOSIS — I5032 Chronic diastolic (congestive) heart failure: Secondary | ICD-10-CM | POA: Diagnosis not present

## 2021-12-04 DIAGNOSIS — Z23 Encounter for immunization: Secondary | ICD-10-CM | POA: Diagnosis not present

## 2021-12-04 DIAGNOSIS — I110 Hypertensive heart disease with heart failure: Secondary | ICD-10-CM | POA: Diagnosis not present

## 2021-12-04 DIAGNOSIS — E782 Mixed hyperlipidemia: Secondary | ICD-10-CM | POA: Diagnosis not present

## 2021-12-04 DIAGNOSIS — J9601 Acute respiratory failure with hypoxia: Secondary | ICD-10-CM | POA: Diagnosis not present

## 2021-12-04 DIAGNOSIS — J449 Chronic obstructive pulmonary disease, unspecified: Secondary | ICD-10-CM | POA: Diagnosis not present

## 2021-12-04 DIAGNOSIS — R2681 Unsteadiness on feet: Secondary | ICD-10-CM | POA: Diagnosis not present

## 2021-12-04 DIAGNOSIS — M6281 Muscle weakness (generalized): Secondary | ICD-10-CM | POA: Diagnosis not present

## 2021-12-05 DIAGNOSIS — I480 Paroxysmal atrial fibrillation: Secondary | ICD-10-CM | POA: Diagnosis not present

## 2021-12-05 DIAGNOSIS — I110 Hypertensive heart disease with heart failure: Secondary | ICD-10-CM | POA: Diagnosis not present

## 2021-12-05 DIAGNOSIS — J9601 Acute respiratory failure with hypoxia: Secondary | ICD-10-CM | POA: Diagnosis not present

## 2021-12-05 DIAGNOSIS — R2681 Unsteadiness on feet: Secondary | ICD-10-CM | POA: Diagnosis not present

## 2021-12-05 DIAGNOSIS — Z23 Encounter for immunization: Secondary | ICD-10-CM | POA: Diagnosis not present

## 2021-12-05 DIAGNOSIS — J449 Chronic obstructive pulmonary disease, unspecified: Secondary | ICD-10-CM | POA: Diagnosis not present

## 2021-12-05 DIAGNOSIS — E782 Mixed hyperlipidemia: Secondary | ICD-10-CM | POA: Diagnosis not present

## 2021-12-05 DIAGNOSIS — M6281 Muscle weakness (generalized): Secondary | ICD-10-CM | POA: Diagnosis not present

## 2021-12-05 DIAGNOSIS — I5032 Chronic diastolic (congestive) heart failure: Secondary | ICD-10-CM | POA: Diagnosis not present

## 2021-12-06 DIAGNOSIS — E782 Mixed hyperlipidemia: Secondary | ICD-10-CM | POA: Diagnosis not present

## 2021-12-06 DIAGNOSIS — J449 Chronic obstructive pulmonary disease, unspecified: Secondary | ICD-10-CM | POA: Diagnosis not present

## 2021-12-06 DIAGNOSIS — Z23 Encounter for immunization: Secondary | ICD-10-CM | POA: Diagnosis not present

## 2021-12-06 DIAGNOSIS — R2681 Unsteadiness on feet: Secondary | ICD-10-CM | POA: Diagnosis not present

## 2021-12-06 DIAGNOSIS — M6281 Muscle weakness (generalized): Secondary | ICD-10-CM | POA: Diagnosis not present

## 2021-12-06 DIAGNOSIS — I110 Hypertensive heart disease with heart failure: Secondary | ICD-10-CM | POA: Diagnosis not present

## 2021-12-06 DIAGNOSIS — J9601 Acute respiratory failure with hypoxia: Secondary | ICD-10-CM | POA: Diagnosis not present

## 2021-12-06 DIAGNOSIS — I480 Paroxysmal atrial fibrillation: Secondary | ICD-10-CM | POA: Diagnosis not present

## 2021-12-06 DIAGNOSIS — I5032 Chronic diastolic (congestive) heart failure: Secondary | ICD-10-CM | POA: Diagnosis not present

## 2021-12-07 DIAGNOSIS — I509 Heart failure, unspecified: Secondary | ICD-10-CM | POA: Diagnosis not present

## 2021-12-07 DIAGNOSIS — E785 Hyperlipidemia, unspecified: Secondary | ICD-10-CM | POA: Diagnosis not present

## 2021-12-07 DIAGNOSIS — J449 Chronic obstructive pulmonary disease, unspecified: Secondary | ICD-10-CM | POA: Diagnosis not present

## 2021-12-11 DIAGNOSIS — J9601 Acute respiratory failure with hypoxia: Secondary | ICD-10-CM | POA: Diagnosis not present

## 2021-12-11 DIAGNOSIS — I5032 Chronic diastolic (congestive) heart failure: Secondary | ICD-10-CM | POA: Diagnosis not present

## 2021-12-11 DIAGNOSIS — Z23 Encounter for immunization: Secondary | ICD-10-CM | POA: Diagnosis not present

## 2021-12-11 DIAGNOSIS — E782 Mixed hyperlipidemia: Secondary | ICD-10-CM | POA: Diagnosis not present

## 2021-12-11 DIAGNOSIS — R2681 Unsteadiness on feet: Secondary | ICD-10-CM | POA: Diagnosis not present

## 2021-12-11 DIAGNOSIS — J449 Chronic obstructive pulmonary disease, unspecified: Secondary | ICD-10-CM | POA: Diagnosis not present

## 2021-12-11 DIAGNOSIS — I110 Hypertensive heart disease with heart failure: Secondary | ICD-10-CM | POA: Diagnosis not present

## 2021-12-11 DIAGNOSIS — I480 Paroxysmal atrial fibrillation: Secondary | ICD-10-CM | POA: Diagnosis not present

## 2021-12-11 DIAGNOSIS — M6281 Muscle weakness (generalized): Secondary | ICD-10-CM | POA: Diagnosis not present

## 2021-12-12 DIAGNOSIS — I110 Hypertensive heart disease with heart failure: Secondary | ICD-10-CM | POA: Diagnosis not present

## 2021-12-12 DIAGNOSIS — I5032 Chronic diastolic (congestive) heart failure: Secondary | ICD-10-CM | POA: Diagnosis not present

## 2021-12-12 DIAGNOSIS — E559 Vitamin D deficiency, unspecified: Secondary | ICD-10-CM | POA: Diagnosis not present

## 2021-12-12 DIAGNOSIS — Z23 Encounter for immunization: Secondary | ICD-10-CM | POA: Diagnosis not present

## 2021-12-12 DIAGNOSIS — E782 Mixed hyperlipidemia: Secondary | ICD-10-CM | POA: Diagnosis not present

## 2021-12-12 DIAGNOSIS — J449 Chronic obstructive pulmonary disease, unspecified: Secondary | ICD-10-CM | POA: Diagnosis not present

## 2021-12-12 DIAGNOSIS — J9601 Acute respiratory failure with hypoxia: Secondary | ICD-10-CM | POA: Diagnosis not present

## 2021-12-12 DIAGNOSIS — M6281 Muscle weakness (generalized): Secondary | ICD-10-CM | POA: Diagnosis not present

## 2021-12-12 DIAGNOSIS — I480 Paroxysmal atrial fibrillation: Secondary | ICD-10-CM | POA: Diagnosis not present

## 2021-12-12 DIAGNOSIS — R2681 Unsteadiness on feet: Secondary | ICD-10-CM | POA: Diagnosis not present

## 2021-12-12 DIAGNOSIS — E119 Type 2 diabetes mellitus without complications: Secondary | ICD-10-CM | POA: Diagnosis not present

## 2021-12-12 DIAGNOSIS — N390 Urinary tract infection, site not specified: Secondary | ICD-10-CM | POA: Diagnosis not present

## 2021-12-17 DIAGNOSIS — I110 Hypertensive heart disease with heart failure: Secondary | ICD-10-CM | POA: Diagnosis not present

## 2021-12-17 DIAGNOSIS — M6281 Muscle weakness (generalized): Secondary | ICD-10-CM | POA: Diagnosis not present

## 2021-12-17 DIAGNOSIS — Z23 Encounter for immunization: Secondary | ICD-10-CM | POA: Diagnosis not present

## 2021-12-17 DIAGNOSIS — J9601 Acute respiratory failure with hypoxia: Secondary | ICD-10-CM | POA: Diagnosis not present

## 2021-12-17 DIAGNOSIS — I480 Paroxysmal atrial fibrillation: Secondary | ICD-10-CM | POA: Diagnosis not present

## 2021-12-17 DIAGNOSIS — J449 Chronic obstructive pulmonary disease, unspecified: Secondary | ICD-10-CM | POA: Diagnosis not present

## 2021-12-17 DIAGNOSIS — I5032 Chronic diastolic (congestive) heart failure: Secondary | ICD-10-CM | POA: Diagnosis not present

## 2021-12-17 DIAGNOSIS — E782 Mixed hyperlipidemia: Secondary | ICD-10-CM | POA: Diagnosis not present

## 2021-12-17 DIAGNOSIS — R2681 Unsteadiness on feet: Secondary | ICD-10-CM | POA: Diagnosis not present

## 2021-12-27 DIAGNOSIS — R051 Acute cough: Secondary | ICD-10-CM | POA: Diagnosis not present

## 2021-12-27 DIAGNOSIS — U071 COVID-19: Secondary | ICD-10-CM | POA: Diagnosis not present

## 2021-12-28 DIAGNOSIS — U071 COVID-19: Secondary | ICD-10-CM | POA: Diagnosis not present

## 2021-12-28 DIAGNOSIS — G4700 Insomnia, unspecified: Secondary | ICD-10-CM | POA: Diagnosis not present

## 2021-12-28 DIAGNOSIS — J449 Chronic obstructive pulmonary disease, unspecified: Secondary | ICD-10-CM | POA: Diagnosis not present

## 2022-01-01 DIAGNOSIS — U071 COVID-19: Secondary | ICD-10-CM | POA: Diagnosis not present

## 2022-01-01 DIAGNOSIS — J449 Chronic obstructive pulmonary disease, unspecified: Secondary | ICD-10-CM | POA: Diagnosis not present

## 2022-01-01 DIAGNOSIS — G4700 Insomnia, unspecified: Secondary | ICD-10-CM | POA: Diagnosis not present

## 2022-01-08 DIAGNOSIS — I110 Hypertensive heart disease with heart failure: Secondary | ICD-10-CM | POA: Diagnosis not present

## 2022-01-08 DIAGNOSIS — R2681 Unsteadiness on feet: Secondary | ICD-10-CM | POA: Diagnosis not present

## 2022-01-08 DIAGNOSIS — I5032 Chronic diastolic (congestive) heart failure: Secondary | ICD-10-CM | POA: Diagnosis not present

## 2022-01-08 DIAGNOSIS — I480 Paroxysmal atrial fibrillation: Secondary | ICD-10-CM | POA: Diagnosis not present

## 2022-01-08 DIAGNOSIS — U071 COVID-19: Secondary | ICD-10-CM | POA: Diagnosis not present

## 2022-01-08 DIAGNOSIS — J449 Chronic obstructive pulmonary disease, unspecified: Secondary | ICD-10-CM | POA: Diagnosis not present

## 2022-01-08 DIAGNOSIS — E782 Mixed hyperlipidemia: Secondary | ICD-10-CM | POA: Diagnosis not present

## 2022-01-08 DIAGNOSIS — M6281 Muscle weakness (generalized): Secondary | ICD-10-CM | POA: Diagnosis not present

## 2022-01-08 DIAGNOSIS — Z23 Encounter for immunization: Secondary | ICD-10-CM | POA: Diagnosis not present

## 2022-01-11 DIAGNOSIS — R791 Abnormal coagulation profile: Secondary | ICD-10-CM | POA: Diagnosis not present

## 2022-01-12 DIAGNOSIS — M6281 Muscle weakness (generalized): Secondary | ICD-10-CM | POA: Diagnosis not present

## 2022-01-12 DIAGNOSIS — E782 Mixed hyperlipidemia: Secondary | ICD-10-CM | POA: Diagnosis not present

## 2022-01-12 DIAGNOSIS — R2681 Unsteadiness on feet: Secondary | ICD-10-CM | POA: Diagnosis not present

## 2022-01-12 DIAGNOSIS — Z23 Encounter for immunization: Secondary | ICD-10-CM | POA: Diagnosis not present

## 2022-01-12 DIAGNOSIS — I5032 Chronic diastolic (congestive) heart failure: Secondary | ICD-10-CM | POA: Diagnosis not present

## 2022-01-12 DIAGNOSIS — I110 Hypertensive heart disease with heart failure: Secondary | ICD-10-CM | POA: Diagnosis not present

## 2022-01-12 DIAGNOSIS — U071 COVID-19: Secondary | ICD-10-CM | POA: Diagnosis not present

## 2022-01-12 DIAGNOSIS — I480 Paroxysmal atrial fibrillation: Secondary | ICD-10-CM | POA: Diagnosis not present

## 2022-01-12 DIAGNOSIS — J449 Chronic obstructive pulmonary disease, unspecified: Secondary | ICD-10-CM | POA: Diagnosis not present

## 2022-01-14 DIAGNOSIS — E782 Mixed hyperlipidemia: Secondary | ICD-10-CM | POA: Diagnosis not present

## 2022-01-14 DIAGNOSIS — U071 COVID-19: Secondary | ICD-10-CM | POA: Diagnosis not present

## 2022-01-14 DIAGNOSIS — J449 Chronic obstructive pulmonary disease, unspecified: Secondary | ICD-10-CM | POA: Diagnosis not present

## 2022-01-14 DIAGNOSIS — R2681 Unsteadiness on feet: Secondary | ICD-10-CM | POA: Diagnosis not present

## 2022-01-14 DIAGNOSIS — I110 Hypertensive heart disease with heart failure: Secondary | ICD-10-CM | POA: Diagnosis not present

## 2022-01-14 DIAGNOSIS — M6281 Muscle weakness (generalized): Secondary | ICD-10-CM | POA: Diagnosis not present

## 2022-01-14 DIAGNOSIS — I5032 Chronic diastolic (congestive) heart failure: Secondary | ICD-10-CM | POA: Diagnosis not present

## 2022-01-14 DIAGNOSIS — I480 Paroxysmal atrial fibrillation: Secondary | ICD-10-CM | POA: Diagnosis not present

## 2022-01-14 DIAGNOSIS — Z23 Encounter for immunization: Secondary | ICD-10-CM | POA: Diagnosis not present

## 2022-01-15 DIAGNOSIS — R2681 Unsteadiness on feet: Secondary | ICD-10-CM | POA: Diagnosis not present

## 2022-01-15 DIAGNOSIS — E782 Mixed hyperlipidemia: Secondary | ICD-10-CM | POA: Diagnosis not present

## 2022-01-15 DIAGNOSIS — I5032 Chronic diastolic (congestive) heart failure: Secondary | ICD-10-CM | POA: Diagnosis not present

## 2022-01-15 DIAGNOSIS — J449 Chronic obstructive pulmonary disease, unspecified: Secondary | ICD-10-CM | POA: Diagnosis not present

## 2022-01-15 DIAGNOSIS — Z23 Encounter for immunization: Secondary | ICD-10-CM | POA: Diagnosis not present

## 2022-01-15 DIAGNOSIS — U071 COVID-19: Secondary | ICD-10-CM | POA: Diagnosis not present

## 2022-01-15 DIAGNOSIS — M6281 Muscle weakness (generalized): Secondary | ICD-10-CM | POA: Diagnosis not present

## 2022-01-15 DIAGNOSIS — I480 Paroxysmal atrial fibrillation: Secondary | ICD-10-CM | POA: Diagnosis not present

## 2022-01-15 DIAGNOSIS — I110 Hypertensive heart disease with heart failure: Secondary | ICD-10-CM | POA: Diagnosis not present

## 2022-01-16 DIAGNOSIS — I5032 Chronic diastolic (congestive) heart failure: Secondary | ICD-10-CM | POA: Diagnosis not present

## 2022-01-16 DIAGNOSIS — J449 Chronic obstructive pulmonary disease, unspecified: Secondary | ICD-10-CM | POA: Diagnosis not present

## 2022-01-16 DIAGNOSIS — R2681 Unsteadiness on feet: Secondary | ICD-10-CM | POA: Diagnosis not present

## 2022-01-16 DIAGNOSIS — I480 Paroxysmal atrial fibrillation: Secondary | ICD-10-CM | POA: Diagnosis not present

## 2022-01-16 DIAGNOSIS — U071 COVID-19: Secondary | ICD-10-CM | POA: Diagnosis not present

## 2022-01-16 DIAGNOSIS — I110 Hypertensive heart disease with heart failure: Secondary | ICD-10-CM | POA: Diagnosis not present

## 2022-01-16 DIAGNOSIS — Z23 Encounter for immunization: Secondary | ICD-10-CM | POA: Diagnosis not present

## 2022-01-16 DIAGNOSIS — E782 Mixed hyperlipidemia: Secondary | ICD-10-CM | POA: Diagnosis not present

## 2022-01-16 DIAGNOSIS — M6281 Muscle weakness (generalized): Secondary | ICD-10-CM | POA: Diagnosis not present

## 2022-01-17 DIAGNOSIS — I480 Paroxysmal atrial fibrillation: Secondary | ICD-10-CM | POA: Diagnosis not present

## 2022-01-17 DIAGNOSIS — I110 Hypertensive heart disease with heart failure: Secondary | ICD-10-CM | POA: Diagnosis not present

## 2022-01-17 DIAGNOSIS — E782 Mixed hyperlipidemia: Secondary | ICD-10-CM | POA: Diagnosis not present

## 2022-01-17 DIAGNOSIS — R2681 Unsteadiness on feet: Secondary | ICD-10-CM | POA: Diagnosis not present

## 2022-01-17 DIAGNOSIS — J449 Chronic obstructive pulmonary disease, unspecified: Secondary | ICD-10-CM | POA: Diagnosis not present

## 2022-01-17 DIAGNOSIS — U071 COVID-19: Secondary | ICD-10-CM | POA: Diagnosis not present

## 2022-01-17 DIAGNOSIS — M6281 Muscle weakness (generalized): Secondary | ICD-10-CM | POA: Diagnosis not present

## 2022-01-17 DIAGNOSIS — I4891 Unspecified atrial fibrillation: Secondary | ICD-10-CM | POA: Diagnosis not present

## 2022-01-17 DIAGNOSIS — Z23 Encounter for immunization: Secondary | ICD-10-CM | POA: Diagnosis not present

## 2022-01-17 DIAGNOSIS — I5032 Chronic diastolic (congestive) heart failure: Secondary | ICD-10-CM | POA: Diagnosis not present

## 2022-01-22 DIAGNOSIS — Z23 Encounter for immunization: Secondary | ICD-10-CM | POA: Diagnosis not present

## 2022-01-22 DIAGNOSIS — U071 COVID-19: Secondary | ICD-10-CM | POA: Diagnosis not present

## 2022-01-22 DIAGNOSIS — R2681 Unsteadiness on feet: Secondary | ICD-10-CM | POA: Diagnosis not present

## 2022-01-22 DIAGNOSIS — E782 Mixed hyperlipidemia: Secondary | ICD-10-CM | POA: Diagnosis not present

## 2022-01-22 DIAGNOSIS — I5032 Chronic diastolic (congestive) heart failure: Secondary | ICD-10-CM | POA: Diagnosis not present

## 2022-01-22 DIAGNOSIS — I480 Paroxysmal atrial fibrillation: Secondary | ICD-10-CM | POA: Diagnosis not present

## 2022-01-22 DIAGNOSIS — M6281 Muscle weakness (generalized): Secondary | ICD-10-CM | POA: Diagnosis not present

## 2022-01-22 DIAGNOSIS — I110 Hypertensive heart disease with heart failure: Secondary | ICD-10-CM | POA: Diagnosis not present

## 2022-01-22 DIAGNOSIS — J449 Chronic obstructive pulmonary disease, unspecified: Secondary | ICD-10-CM | POA: Diagnosis not present

## 2022-01-25 DIAGNOSIS — M6281 Muscle weakness (generalized): Secondary | ICD-10-CM | POA: Diagnosis not present

## 2022-01-25 DIAGNOSIS — I480 Paroxysmal atrial fibrillation: Secondary | ICD-10-CM | POA: Diagnosis not present

## 2022-01-25 DIAGNOSIS — I110 Hypertensive heart disease with heart failure: Secondary | ICD-10-CM | POA: Diagnosis not present

## 2022-01-25 DIAGNOSIS — J449 Chronic obstructive pulmonary disease, unspecified: Secondary | ICD-10-CM | POA: Diagnosis not present

## 2022-01-25 DIAGNOSIS — I5032 Chronic diastolic (congestive) heart failure: Secondary | ICD-10-CM | POA: Diagnosis not present

## 2022-01-25 DIAGNOSIS — U071 COVID-19: Secondary | ICD-10-CM | POA: Diagnosis not present

## 2022-01-25 DIAGNOSIS — E782 Mixed hyperlipidemia: Secondary | ICD-10-CM | POA: Diagnosis not present

## 2022-01-25 DIAGNOSIS — R2681 Unsteadiness on feet: Secondary | ICD-10-CM | POA: Diagnosis not present

## 2022-01-25 DIAGNOSIS — Z23 Encounter for immunization: Secondary | ICD-10-CM | POA: Diagnosis not present

## 2022-01-26 DIAGNOSIS — B351 Tinea unguium: Secondary | ICD-10-CM | POA: Diagnosis not present

## 2022-01-26 DIAGNOSIS — I7091 Generalized atherosclerosis: Secondary | ICD-10-CM | POA: Diagnosis not present

## 2022-01-28 DIAGNOSIS — Z23 Encounter for immunization: Secondary | ICD-10-CM | POA: Diagnosis not present

## 2022-01-28 DIAGNOSIS — I480 Paroxysmal atrial fibrillation: Secondary | ICD-10-CM | POA: Diagnosis not present

## 2022-01-28 DIAGNOSIS — E782 Mixed hyperlipidemia: Secondary | ICD-10-CM | POA: Diagnosis not present

## 2022-01-28 DIAGNOSIS — I110 Hypertensive heart disease with heart failure: Secondary | ICD-10-CM | POA: Diagnosis not present

## 2022-01-28 DIAGNOSIS — U071 COVID-19: Secondary | ICD-10-CM | POA: Diagnosis not present

## 2022-01-28 DIAGNOSIS — I5032 Chronic diastolic (congestive) heart failure: Secondary | ICD-10-CM | POA: Diagnosis not present

## 2022-01-28 DIAGNOSIS — M6281 Muscle weakness (generalized): Secondary | ICD-10-CM | POA: Diagnosis not present

## 2022-01-28 DIAGNOSIS — R2681 Unsteadiness on feet: Secondary | ICD-10-CM | POA: Diagnosis not present

## 2022-01-28 DIAGNOSIS — J449 Chronic obstructive pulmonary disease, unspecified: Secondary | ICD-10-CM | POA: Diagnosis not present

## 2022-01-29 DIAGNOSIS — I480 Paroxysmal atrial fibrillation: Secondary | ICD-10-CM | POA: Diagnosis not present

## 2022-01-29 DIAGNOSIS — Z23 Encounter for immunization: Secondary | ICD-10-CM | POA: Diagnosis not present

## 2022-01-29 DIAGNOSIS — J449 Chronic obstructive pulmonary disease, unspecified: Secondary | ICD-10-CM | POA: Diagnosis not present

## 2022-01-29 DIAGNOSIS — E782 Mixed hyperlipidemia: Secondary | ICD-10-CM | POA: Diagnosis not present

## 2022-01-29 DIAGNOSIS — R2681 Unsteadiness on feet: Secondary | ICD-10-CM | POA: Diagnosis not present

## 2022-01-29 DIAGNOSIS — I110 Hypertensive heart disease with heart failure: Secondary | ICD-10-CM | POA: Diagnosis not present

## 2022-01-29 DIAGNOSIS — M6281 Muscle weakness (generalized): Secondary | ICD-10-CM | POA: Diagnosis not present

## 2022-01-29 DIAGNOSIS — I5032 Chronic diastolic (congestive) heart failure: Secondary | ICD-10-CM | POA: Diagnosis not present

## 2022-01-29 DIAGNOSIS — U071 COVID-19: Secondary | ICD-10-CM | POA: Diagnosis not present

## 2022-01-30 DIAGNOSIS — Z23 Encounter for immunization: Secondary | ICD-10-CM | POA: Diagnosis not present

## 2022-01-30 DIAGNOSIS — I110 Hypertensive heart disease with heart failure: Secondary | ICD-10-CM | POA: Diagnosis not present

## 2022-01-30 DIAGNOSIS — U071 COVID-19: Secondary | ICD-10-CM | POA: Diagnosis not present

## 2022-01-30 DIAGNOSIS — I480 Paroxysmal atrial fibrillation: Secondary | ICD-10-CM | POA: Diagnosis not present

## 2022-01-30 DIAGNOSIS — I5032 Chronic diastolic (congestive) heart failure: Secondary | ICD-10-CM | POA: Diagnosis not present

## 2022-01-30 DIAGNOSIS — E782 Mixed hyperlipidemia: Secondary | ICD-10-CM | POA: Diagnosis not present

## 2022-01-30 DIAGNOSIS — J449 Chronic obstructive pulmonary disease, unspecified: Secondary | ICD-10-CM | POA: Diagnosis not present

## 2022-01-30 DIAGNOSIS — M6281 Muscle weakness (generalized): Secondary | ICD-10-CM | POA: Diagnosis not present

## 2022-01-30 DIAGNOSIS — R2681 Unsteadiness on feet: Secondary | ICD-10-CM | POA: Diagnosis not present

## 2022-01-31 DIAGNOSIS — I5032 Chronic diastolic (congestive) heart failure: Secondary | ICD-10-CM | POA: Diagnosis not present

## 2022-01-31 DIAGNOSIS — Z23 Encounter for immunization: Secondary | ICD-10-CM | POA: Diagnosis not present

## 2022-01-31 DIAGNOSIS — E782 Mixed hyperlipidemia: Secondary | ICD-10-CM | POA: Diagnosis not present

## 2022-01-31 DIAGNOSIS — I110 Hypertensive heart disease with heart failure: Secondary | ICD-10-CM | POA: Diagnosis not present

## 2022-01-31 DIAGNOSIS — M6281 Muscle weakness (generalized): Secondary | ICD-10-CM | POA: Diagnosis not present

## 2022-01-31 DIAGNOSIS — I480 Paroxysmal atrial fibrillation: Secondary | ICD-10-CM | POA: Diagnosis not present

## 2022-01-31 DIAGNOSIS — U071 COVID-19: Secondary | ICD-10-CM | POA: Diagnosis not present

## 2022-01-31 DIAGNOSIS — R2681 Unsteadiness on feet: Secondary | ICD-10-CM | POA: Diagnosis not present

## 2022-01-31 DIAGNOSIS — J449 Chronic obstructive pulmonary disease, unspecified: Secondary | ICD-10-CM | POA: Diagnosis not present

## 2022-02-01 DIAGNOSIS — J449 Chronic obstructive pulmonary disease, unspecified: Secondary | ICD-10-CM | POA: Diagnosis not present

## 2022-02-01 DIAGNOSIS — M6281 Muscle weakness (generalized): Secondary | ICD-10-CM | POA: Diagnosis not present

## 2022-02-01 DIAGNOSIS — E782 Mixed hyperlipidemia: Secondary | ICD-10-CM | POA: Diagnosis not present

## 2022-02-01 DIAGNOSIS — Z23 Encounter for immunization: Secondary | ICD-10-CM | POA: Diagnosis not present

## 2022-02-01 DIAGNOSIS — I5032 Chronic diastolic (congestive) heart failure: Secondary | ICD-10-CM | POA: Diagnosis not present

## 2022-02-01 DIAGNOSIS — U071 COVID-19: Secondary | ICD-10-CM | POA: Diagnosis not present

## 2022-02-01 DIAGNOSIS — R2681 Unsteadiness on feet: Secondary | ICD-10-CM | POA: Diagnosis not present

## 2022-02-01 DIAGNOSIS — I480 Paroxysmal atrial fibrillation: Secondary | ICD-10-CM | POA: Diagnosis not present

## 2022-02-01 DIAGNOSIS — I110 Hypertensive heart disease with heart failure: Secondary | ICD-10-CM | POA: Diagnosis not present

## 2022-02-06 DIAGNOSIS — I110 Hypertensive heart disease with heart failure: Secondary | ICD-10-CM | POA: Diagnosis not present

## 2022-02-06 DIAGNOSIS — Z23 Encounter for immunization: Secondary | ICD-10-CM | POA: Diagnosis not present

## 2022-02-06 DIAGNOSIS — R2681 Unsteadiness on feet: Secondary | ICD-10-CM | POA: Diagnosis not present

## 2022-02-06 DIAGNOSIS — M6281 Muscle weakness (generalized): Secondary | ICD-10-CM | POA: Diagnosis not present

## 2022-02-06 DIAGNOSIS — J449 Chronic obstructive pulmonary disease, unspecified: Secondary | ICD-10-CM | POA: Diagnosis not present

## 2022-02-06 DIAGNOSIS — R000 Tachycardia, unspecified: Secondary | ICD-10-CM | POA: Diagnosis not present

## 2022-02-06 DIAGNOSIS — J9601 Acute respiratory failure with hypoxia: Secondary | ICD-10-CM | POA: Diagnosis not present

## 2022-02-06 DIAGNOSIS — E782 Mixed hyperlipidemia: Secondary | ICD-10-CM | POA: Diagnosis not present

## 2022-02-06 DIAGNOSIS — I480 Paroxysmal atrial fibrillation: Secondary | ICD-10-CM | POA: Diagnosis not present

## 2022-02-10 DIAGNOSIS — J984 Other disorders of lung: Secondary | ICD-10-CM | POA: Diagnosis not present

## 2022-02-11 DIAGNOSIS — I509 Heart failure, unspecified: Secondary | ICD-10-CM | POA: Diagnosis not present

## 2022-02-11 DIAGNOSIS — J449 Chronic obstructive pulmonary disease, unspecified: Secondary | ICD-10-CM | POA: Diagnosis not present

## 2022-02-11 DIAGNOSIS — J189 Pneumonia, unspecified organism: Secondary | ICD-10-CM | POA: Diagnosis not present

## 2022-02-19 DIAGNOSIS — J449 Chronic obstructive pulmonary disease, unspecified: Secondary | ICD-10-CM | POA: Diagnosis not present

## 2022-02-19 DIAGNOSIS — J9811 Atelectasis: Secondary | ICD-10-CM | POA: Diagnosis not present

## 2022-02-19 DIAGNOSIS — I509 Heart failure, unspecified: Secondary | ICD-10-CM | POA: Diagnosis not present

## 2022-02-19 DIAGNOSIS — J189 Pneumonia, unspecified organism: Secondary | ICD-10-CM | POA: Diagnosis not present

## 2022-02-25 DIAGNOSIS — F419 Anxiety disorder, unspecified: Secondary | ICD-10-CM | POA: Diagnosis not present

## 2022-02-28 DIAGNOSIS — R0602 Shortness of breath: Secondary | ICD-10-CM | POA: Diagnosis not present

## 2022-03-10 DIAGNOSIS — R110 Nausea: Secondary | ICD-10-CM | POA: Diagnosis not present

## 2022-03-10 DIAGNOSIS — I110 Hypertensive heart disease with heart failure: Secondary | ICD-10-CM | POA: Diagnosis not present

## 2022-03-10 DIAGNOSIS — Z993 Dependence on wheelchair: Secondary | ICD-10-CM | POA: Diagnosis not present

## 2022-03-10 DIAGNOSIS — J45901 Unspecified asthma with (acute) exacerbation: Secondary | ICD-10-CM | POA: Diagnosis not present

## 2022-03-10 DIAGNOSIS — J9601 Acute respiratory failure with hypoxia: Secondary | ICD-10-CM | POA: Diagnosis not present

## 2022-03-10 DIAGNOSIS — J9621 Acute and chronic respiratory failure with hypoxia: Secondary | ICD-10-CM | POA: Diagnosis not present

## 2022-03-10 DIAGNOSIS — R0902 Hypoxemia: Secondary | ICD-10-CM | POA: Diagnosis not present

## 2022-03-10 DIAGNOSIS — N289 Disorder of kidney and ureter, unspecified: Secondary | ICD-10-CM | POA: Diagnosis not present

## 2022-03-10 DIAGNOSIS — Z743 Need for continuous supervision: Secondary | ICD-10-CM | POA: Diagnosis not present

## 2022-03-10 DIAGNOSIS — J210 Acute bronchiolitis due to respiratory syncytial virus: Secondary | ICD-10-CM | POA: Diagnosis not present

## 2022-03-10 DIAGNOSIS — I5032 Chronic diastolic (congestive) heart failure: Secondary | ICD-10-CM | POA: Diagnosis not present

## 2022-03-10 DIAGNOSIS — E871 Hypo-osmolality and hyponatremia: Secondary | ICD-10-CM | POA: Diagnosis not present

## 2022-03-10 DIAGNOSIS — Z20822 Contact with and (suspected) exposure to covid-19: Secondary | ICD-10-CM | POA: Diagnosis not present

## 2022-03-10 DIAGNOSIS — R0602 Shortness of breath: Secondary | ICD-10-CM | POA: Diagnosis not present

## 2022-03-10 DIAGNOSIS — R000 Tachycardia, unspecified: Secondary | ICD-10-CM | POA: Diagnosis not present

## 2022-03-10 DIAGNOSIS — B974 Respiratory syncytial virus as the cause of diseases classified elsewhere: Secondary | ICD-10-CM | POA: Diagnosis not present

## 2022-03-10 DIAGNOSIS — R069 Unspecified abnormalities of breathing: Secondary | ICD-10-CM | POA: Diagnosis not present

## 2022-03-10 DIAGNOSIS — N390 Urinary tract infection, site not specified: Secondary | ICD-10-CM | POA: Diagnosis not present

## 2022-03-10 DIAGNOSIS — R319 Hematuria, unspecified: Secondary | ICD-10-CM | POA: Diagnosis not present

## 2022-03-10 DIAGNOSIS — F17200 Nicotine dependence, unspecified, uncomplicated: Secondary | ICD-10-CM | POA: Diagnosis not present

## 2022-03-10 DIAGNOSIS — I491 Atrial premature depolarization: Secondary | ICD-10-CM | POA: Diagnosis not present

## 2022-03-10 DIAGNOSIS — J441 Chronic obstructive pulmonary disease with (acute) exacerbation: Secondary | ICD-10-CM | POA: Diagnosis not present

## 2022-03-10 DIAGNOSIS — R531 Weakness: Secondary | ICD-10-CM | POA: Diagnosis not present

## 2022-03-10 DIAGNOSIS — Z66 Do not resuscitate: Secondary | ICD-10-CM | POA: Diagnosis not present

## 2022-03-10 DIAGNOSIS — Z87891 Personal history of nicotine dependence: Secondary | ICD-10-CM | POA: Diagnosis not present

## 2022-03-11 DIAGNOSIS — R319 Hematuria, unspecified: Secondary | ICD-10-CM | POA: Diagnosis not present

## 2022-03-11 DIAGNOSIS — J9621 Acute and chronic respiratory failure with hypoxia: Secondary | ICD-10-CM | POA: Diagnosis not present

## 2022-03-11 DIAGNOSIS — Z66 Do not resuscitate: Secondary | ICD-10-CM | POA: Diagnosis not present

## 2022-03-11 DIAGNOSIS — N390 Urinary tract infection, site not specified: Secondary | ICD-10-CM | POA: Diagnosis not present

## 2022-03-11 DIAGNOSIS — B974 Respiratory syncytial virus as the cause of diseases classified elsewhere: Secondary | ICD-10-CM | POA: Diagnosis not present

## 2022-03-11 DIAGNOSIS — N289 Disorder of kidney and ureter, unspecified: Secondary | ICD-10-CM | POA: Diagnosis not present

## 2022-03-11 DIAGNOSIS — E871 Hypo-osmolality and hyponatremia: Secondary | ICD-10-CM | POA: Diagnosis not present

## 2022-03-11 DIAGNOSIS — J441 Chronic obstructive pulmonary disease with (acute) exacerbation: Secondary | ICD-10-CM | POA: Diagnosis not present

## 2022-03-11 DIAGNOSIS — I491 Atrial premature depolarization: Secondary | ICD-10-CM | POA: Diagnosis not present

## 2022-03-12 DIAGNOSIS — J9621 Acute and chronic respiratory failure with hypoxia: Secondary | ICD-10-CM | POA: Diagnosis not present

## 2022-03-12 DIAGNOSIS — B974 Respiratory syncytial virus as the cause of diseases classified elsewhere: Secondary | ICD-10-CM | POA: Diagnosis not present

## 2022-03-12 DIAGNOSIS — J441 Chronic obstructive pulmonary disease with (acute) exacerbation: Secondary | ICD-10-CM | POA: Diagnosis not present

## 2022-03-12 DIAGNOSIS — N289 Disorder of kidney and ureter, unspecified: Secondary | ICD-10-CM | POA: Diagnosis not present

## 2022-03-12 DIAGNOSIS — E871 Hypo-osmolality and hyponatremia: Secondary | ICD-10-CM | POA: Diagnosis not present

## 2022-03-12 DIAGNOSIS — R319 Hematuria, unspecified: Secondary | ICD-10-CM | POA: Diagnosis not present

## 2022-03-12 DIAGNOSIS — N390 Urinary tract infection, site not specified: Secondary | ICD-10-CM | POA: Diagnosis not present

## 2022-03-13 DIAGNOSIS — R531 Weakness: Secondary | ICD-10-CM | POA: Diagnosis not present

## 2022-03-13 DIAGNOSIS — B974 Respiratory syncytial virus as the cause of diseases classified elsewhere: Secondary | ICD-10-CM | POA: Diagnosis not present

## 2022-03-13 DIAGNOSIS — Z87891 Personal history of nicotine dependence: Secondary | ICD-10-CM | POA: Diagnosis not present

## 2022-03-13 DIAGNOSIS — F17200 Nicotine dependence, unspecified, uncomplicated: Secondary | ICD-10-CM | POA: Diagnosis not present

## 2022-03-13 DIAGNOSIS — J441 Chronic obstructive pulmonary disease with (acute) exacerbation: Secondary | ICD-10-CM | POA: Diagnosis not present

## 2022-03-13 DIAGNOSIS — J9601 Acute respiratory failure with hypoxia: Secondary | ICD-10-CM | POA: Diagnosis not present

## 2022-03-13 DIAGNOSIS — Z743 Need for continuous supervision: Secondary | ICD-10-CM | POA: Diagnosis not present

## 2022-03-13 DIAGNOSIS — Z993 Dependence on wheelchair: Secondary | ICD-10-CM | POA: Diagnosis not present

## 2022-03-14 DIAGNOSIS — I5032 Chronic diastolic (congestive) heart failure: Secondary | ICD-10-CM | POA: Diagnosis not present

## 2022-03-14 DIAGNOSIS — E785 Hyperlipidemia, unspecified: Secondary | ICD-10-CM | POA: Diagnosis not present

## 2022-03-14 DIAGNOSIS — I110 Hypertensive heart disease with heart failure: Secondary | ICD-10-CM | POA: Diagnosis not present

## 2022-03-14 DIAGNOSIS — J449 Chronic obstructive pulmonary disease, unspecified: Secondary | ICD-10-CM | POA: Diagnosis not present

## 2022-03-14 DIAGNOSIS — E119 Type 2 diabetes mellitus without complications: Secondary | ICD-10-CM | POA: Diagnosis not present

## 2022-03-14 DIAGNOSIS — I509 Heart failure, unspecified: Secondary | ICD-10-CM | POA: Diagnosis not present

## 2022-03-14 DIAGNOSIS — E559 Vitamin D deficiency, unspecified: Secondary | ICD-10-CM | POA: Diagnosis not present

## 2022-03-14 DIAGNOSIS — I10 Essential (primary) hypertension: Secondary | ICD-10-CM | POA: Diagnosis not present

## 2022-03-14 DIAGNOSIS — I480 Paroxysmal atrial fibrillation: Secondary | ICD-10-CM | POA: Diagnosis not present

## 2022-03-14 DIAGNOSIS — D519 Vitamin B12 deficiency anemia, unspecified: Secondary | ICD-10-CM | POA: Diagnosis not present

## 2022-03-14 DIAGNOSIS — E74819 Disorders of glucose transport, unspecified: Secondary | ICD-10-CM | POA: Diagnosis not present

## 2022-03-14 DIAGNOSIS — I4891 Unspecified atrial fibrillation: Secondary | ICD-10-CM | POA: Diagnosis not present

## 2022-03-15 DIAGNOSIS — I5032 Chronic diastolic (congestive) heart failure: Secondary | ICD-10-CM | POA: Diagnosis not present

## 2022-03-15 DIAGNOSIS — J209 Acute bronchitis, unspecified: Secondary | ICD-10-CM | POA: Diagnosis not present

## 2022-03-15 DIAGNOSIS — D649 Anemia, unspecified: Secondary | ICD-10-CM | POA: Diagnosis not present

## 2022-03-15 DIAGNOSIS — N390 Urinary tract infection, site not specified: Secondary | ICD-10-CM | POA: Diagnosis not present

## 2022-03-20 DIAGNOSIS — I10 Essential (primary) hypertension: Secondary | ICD-10-CM | POA: Diagnosis not present

## 2022-03-20 DIAGNOSIS — J45909 Unspecified asthma, uncomplicated: Secondary | ICD-10-CM | POA: Diagnosis not present

## 2022-03-20 DIAGNOSIS — J449 Chronic obstructive pulmonary disease, unspecified: Secondary | ICD-10-CM | POA: Diagnosis not present

## 2022-03-20 DIAGNOSIS — E785 Hyperlipidemia, unspecified: Secondary | ICD-10-CM | POA: Diagnosis not present

## 2022-03-25 DIAGNOSIS — N390 Urinary tract infection, site not specified: Secondary | ICD-10-CM | POA: Diagnosis not present

## 2022-03-25 DIAGNOSIS — R197 Diarrhea, unspecified: Secondary | ICD-10-CM | POA: Diagnosis not present

## 2022-04-03 DIAGNOSIS — Z23 Encounter for immunization: Secondary | ICD-10-CM | POA: Diagnosis not present

## 2022-04-03 DIAGNOSIS — R2681 Unsteadiness on feet: Secondary | ICD-10-CM | POA: Diagnosis not present

## 2022-04-03 DIAGNOSIS — I110 Hypertensive heart disease with heart failure: Secondary | ICD-10-CM | POA: Diagnosis not present

## 2022-04-03 DIAGNOSIS — J449 Chronic obstructive pulmonary disease, unspecified: Secondary | ICD-10-CM | POA: Diagnosis not present

## 2022-04-03 DIAGNOSIS — I5032 Chronic diastolic (congestive) heart failure: Secondary | ICD-10-CM | POA: Diagnosis not present

## 2022-04-03 DIAGNOSIS — M6281 Muscle weakness (generalized): Secondary | ICD-10-CM | POA: Diagnosis not present

## 2022-04-03 DIAGNOSIS — N390 Urinary tract infection, site not specified: Secondary | ICD-10-CM | POA: Diagnosis not present

## 2022-04-03 DIAGNOSIS — E782 Mixed hyperlipidemia: Secondary | ICD-10-CM | POA: Diagnosis not present

## 2022-04-03 DIAGNOSIS — I480 Paroxysmal atrial fibrillation: Secondary | ICD-10-CM | POA: Diagnosis not present

## 2022-04-12 DIAGNOSIS — I5032 Chronic diastolic (congestive) heart failure: Secondary | ICD-10-CM | POA: Diagnosis not present

## 2022-04-12 DIAGNOSIS — J449 Chronic obstructive pulmonary disease, unspecified: Secondary | ICD-10-CM | POA: Diagnosis not present

## 2022-04-13 DIAGNOSIS — U071 COVID-19: Secondary | ICD-10-CM | POA: Diagnosis not present

## 2022-04-13 DIAGNOSIS — I509 Heart failure, unspecified: Secondary | ICD-10-CM | POA: Diagnosis not present

## 2022-04-13 DIAGNOSIS — I10 Essential (primary) hypertension: Secondary | ICD-10-CM | POA: Diagnosis not present

## 2022-04-13 DIAGNOSIS — E559 Vitamin D deficiency, unspecified: Secondary | ICD-10-CM | POA: Diagnosis not present

## 2022-04-13 DIAGNOSIS — I4891 Unspecified atrial fibrillation: Secondary | ICD-10-CM | POA: Diagnosis not present

## 2022-04-13 DIAGNOSIS — E785 Hyperlipidemia, unspecified: Secondary | ICD-10-CM | POA: Diagnosis not present

## 2022-04-13 DIAGNOSIS — E74819 Disorders of glucose transport, unspecified: Secondary | ICD-10-CM | POA: Diagnosis not present

## 2022-04-15 DIAGNOSIS — J441 Chronic obstructive pulmonary disease with (acute) exacerbation: Secondary | ICD-10-CM | POA: Diagnosis not present

## 2022-04-16 DIAGNOSIS — B351 Tinea unguium: Secondary | ICD-10-CM | POA: Diagnosis not present

## 2022-04-16 DIAGNOSIS — I7091 Generalized atherosclerosis: Secondary | ICD-10-CM | POA: Diagnosis not present

## 2022-04-20 DIAGNOSIS — A0471 Enterocolitis due to Clostridium difficile, recurrent: Secondary | ICD-10-CM | POA: Diagnosis not present

## 2022-04-23 DIAGNOSIS — I5032 Chronic diastolic (congestive) heart failure: Secondary | ICD-10-CM | POA: Diagnosis not present

## 2022-04-23 DIAGNOSIS — J449 Chronic obstructive pulmonary disease, unspecified: Secondary | ICD-10-CM | POA: Diagnosis not present

## 2022-04-23 DIAGNOSIS — I110 Hypertensive heart disease with heart failure: Secondary | ICD-10-CM | POA: Diagnosis not present

## 2022-04-23 DIAGNOSIS — I480 Paroxysmal atrial fibrillation: Secondary | ICD-10-CM | POA: Diagnosis not present

## 2022-05-02 DIAGNOSIS — J441 Chronic obstructive pulmonary disease with (acute) exacerbation: Secondary | ICD-10-CM | POA: Diagnosis not present

## 2022-05-02 DIAGNOSIS — I5032 Chronic diastolic (congestive) heart failure: Secondary | ICD-10-CM | POA: Diagnosis not present

## 2022-05-03 DIAGNOSIS — R7989 Other specified abnormal findings of blood chemistry: Secondary | ICD-10-CM | POA: Diagnosis not present

## 2022-05-03 DIAGNOSIS — R058 Other specified cough: Secondary | ICD-10-CM | POA: Diagnosis not present

## 2022-05-07 DIAGNOSIS — J441 Chronic obstructive pulmonary disease with (acute) exacerbation: Secondary | ICD-10-CM | POA: Diagnosis not present

## 2022-05-10 DIAGNOSIS — F419 Anxiety disorder, unspecified: Secondary | ICD-10-CM | POA: Diagnosis not present

## 2022-05-24 DIAGNOSIS — J449 Chronic obstructive pulmonary disease, unspecified: Secondary | ICD-10-CM | POA: Diagnosis not present

## 2022-05-24 DIAGNOSIS — I5032 Chronic diastolic (congestive) heart failure: Secondary | ICD-10-CM | POA: Diagnosis not present

## 2022-05-24 DIAGNOSIS — I110 Hypertensive heart disease with heart failure: Secondary | ICD-10-CM | POA: Diagnosis not present

## 2022-05-24 DIAGNOSIS — I480 Paroxysmal atrial fibrillation: Secondary | ICD-10-CM | POA: Diagnosis not present

## 2022-05-29 DIAGNOSIS — R059 Cough, unspecified: Secondary | ICD-10-CM | POA: Diagnosis not present

## 2022-05-29 DIAGNOSIS — I10 Essential (primary) hypertension: Secondary | ICD-10-CM | POA: Diagnosis not present

## 2022-05-29 DIAGNOSIS — J449 Chronic obstructive pulmonary disease, unspecified: Secondary | ICD-10-CM | POA: Diagnosis not present

## 2022-06-10 DIAGNOSIS — F419 Anxiety disorder, unspecified: Secondary | ICD-10-CM | POA: Diagnosis not present

## 2022-06-11 DIAGNOSIS — H5213 Myopia, bilateral: Secondary | ICD-10-CM | POA: Diagnosis not present

## 2022-06-11 DIAGNOSIS — H2513 Age-related nuclear cataract, bilateral: Secondary | ICD-10-CM | POA: Diagnosis not present

## 2022-06-12 DIAGNOSIS — Z0100 Encounter for examination of eyes and vision without abnormal findings: Secondary | ICD-10-CM | POA: Diagnosis not present

## 2022-06-19 DIAGNOSIS — I7091 Generalized atherosclerosis: Secondary | ICD-10-CM | POA: Diagnosis not present

## 2022-06-19 DIAGNOSIS — B351 Tinea unguium: Secondary | ICD-10-CM | POA: Diagnosis not present

## 2022-06-20 DIAGNOSIS — I5022 Chronic systolic (congestive) heart failure: Secondary | ICD-10-CM | POA: Diagnosis not present

## 2022-06-24 DIAGNOSIS — I509 Heart failure, unspecified: Secondary | ICD-10-CM | POA: Diagnosis not present

## 2022-06-24 DIAGNOSIS — J449 Chronic obstructive pulmonary disease, unspecified: Secondary | ICD-10-CM | POA: Diagnosis not present

## 2022-06-25 DIAGNOSIS — I5033 Acute on chronic diastolic (congestive) heart failure: Secondary | ICD-10-CM | POA: Diagnosis not present

## 2022-06-25 DIAGNOSIS — J441 Chronic obstructive pulmonary disease with (acute) exacerbation: Secondary | ICD-10-CM | POA: Diagnosis not present

## 2022-06-28 DIAGNOSIS — I501 Left ventricular failure: Secondary | ICD-10-CM | POA: Diagnosis not present

## 2022-07-04 DIAGNOSIS — I110 Hypertensive heart disease with heart failure: Secondary | ICD-10-CM | POA: Diagnosis not present

## 2022-07-04 DIAGNOSIS — I480 Paroxysmal atrial fibrillation: Secondary | ICD-10-CM | POA: Diagnosis not present

## 2022-07-04 DIAGNOSIS — J449 Chronic obstructive pulmonary disease, unspecified: Secondary | ICD-10-CM | POA: Diagnosis not present

## 2022-07-04 DIAGNOSIS — I5032 Chronic diastolic (congestive) heart failure: Secondary | ICD-10-CM | POA: Diagnosis not present

## 2022-07-12 DIAGNOSIS — F419 Anxiety disorder, unspecified: Secondary | ICD-10-CM | POA: Diagnosis not present

## 2022-07-17 DIAGNOSIS — E8770 Fluid overload, unspecified: Secondary | ICD-10-CM | POA: Diagnosis not present

## 2022-07-18 DIAGNOSIS — I509 Heart failure, unspecified: Secondary | ICD-10-CM | POA: Diagnosis not present

## 2022-07-24 DIAGNOSIS — D649 Anemia, unspecified: Secondary | ICD-10-CM | POA: Diagnosis not present

## 2022-07-24 DIAGNOSIS — I10 Essential (primary) hypertension: Secondary | ICD-10-CM | POA: Diagnosis not present

## 2022-07-24 DIAGNOSIS — Z5181 Encounter for therapeutic drug level monitoring: Secondary | ICD-10-CM | POA: Diagnosis not present

## 2022-07-24 DIAGNOSIS — J309 Allergic rhinitis, unspecified: Secondary | ICD-10-CM | POA: Diagnosis not present

## 2022-07-29 DIAGNOSIS — M1389 Other specified arthritis, multiple sites: Secondary | ICD-10-CM | POA: Diagnosis not present

## 2022-07-31 DIAGNOSIS — I4891 Unspecified atrial fibrillation: Secondary | ICD-10-CM | POA: Diagnosis not present

## 2022-07-31 DIAGNOSIS — F419 Anxiety disorder, unspecified: Secondary | ICD-10-CM | POA: Diagnosis not present

## 2022-07-31 DIAGNOSIS — G4700 Insomnia, unspecified: Secondary | ICD-10-CM | POA: Diagnosis not present

## 2022-07-31 DIAGNOSIS — J449 Chronic obstructive pulmonary disease, unspecified: Secondary | ICD-10-CM | POA: Diagnosis not present

## 2022-08-01 DIAGNOSIS — I509 Heart failure, unspecified: Secondary | ICD-10-CM | POA: Diagnosis not present

## 2022-08-01 DIAGNOSIS — I5032 Chronic diastolic (congestive) heart failure: Secondary | ICD-10-CM | POA: Diagnosis not present

## 2022-08-01 DIAGNOSIS — D649 Anemia, unspecified: Secondary | ICD-10-CM | POA: Diagnosis not present

## 2022-08-07 DIAGNOSIS — Z5181 Encounter for therapeutic drug level monitoring: Secondary | ICD-10-CM | POA: Diagnosis not present

## 2022-08-07 DIAGNOSIS — R52 Pain, unspecified: Secondary | ICD-10-CM | POA: Diagnosis not present

## 2022-08-07 DIAGNOSIS — D649 Anemia, unspecified: Secondary | ICD-10-CM | POA: Diagnosis not present

## 2022-08-07 DIAGNOSIS — M1990 Unspecified osteoarthritis, unspecified site: Secondary | ICD-10-CM | POA: Diagnosis not present

## 2022-08-08 DIAGNOSIS — I509 Heart failure, unspecified: Secondary | ICD-10-CM | POA: Diagnosis not present

## 2022-08-08 DIAGNOSIS — I10 Essential (primary) hypertension: Secondary | ICD-10-CM | POA: Diagnosis not present

## 2022-08-09 DIAGNOSIS — I5032 Chronic diastolic (congestive) heart failure: Secondary | ICD-10-CM | POA: Diagnosis not present

## 2022-08-09 DIAGNOSIS — D649 Anemia, unspecified: Secondary | ICD-10-CM | POA: Diagnosis not present

## 2022-08-09 DIAGNOSIS — N1832 Chronic kidney disease, stage 3b: Secondary | ICD-10-CM | POA: Diagnosis not present

## 2022-08-14 DIAGNOSIS — F419 Anxiety disorder, unspecified: Secondary | ICD-10-CM | POA: Diagnosis not present

## 2022-08-14 DIAGNOSIS — Z5181 Encounter for therapeutic drug level monitoring: Secondary | ICD-10-CM | POA: Diagnosis not present

## 2022-08-14 DIAGNOSIS — I10 Essential (primary) hypertension: Secondary | ICD-10-CM | POA: Diagnosis not present

## 2022-08-19 DIAGNOSIS — I7091 Generalized atherosclerosis: Secondary | ICD-10-CM | POA: Diagnosis not present

## 2022-08-19 DIAGNOSIS — B351 Tinea unguium: Secondary | ICD-10-CM | POA: Diagnosis not present

## 2022-08-20 DIAGNOSIS — N1832 Chronic kidney disease, stage 3b: Secondary | ICD-10-CM | POA: Diagnosis not present

## 2022-08-20 DIAGNOSIS — I5032 Chronic diastolic (congestive) heart failure: Secondary | ICD-10-CM | POA: Diagnosis not present

## 2022-08-30 DIAGNOSIS — I5032 Chronic diastolic (congestive) heart failure: Secondary | ICD-10-CM | POA: Diagnosis not present

## 2022-08-30 DIAGNOSIS — I110 Hypertensive heart disease with heart failure: Secondary | ICD-10-CM | POA: Diagnosis not present

## 2022-08-30 DIAGNOSIS — J449 Chronic obstructive pulmonary disease, unspecified: Secondary | ICD-10-CM | POA: Diagnosis not present

## 2022-09-04 DIAGNOSIS — J449 Chronic obstructive pulmonary disease, unspecified: Secondary | ICD-10-CM | POA: Diagnosis not present

## 2022-09-04 DIAGNOSIS — I10 Essential (primary) hypertension: Secondary | ICD-10-CM | POA: Diagnosis not present

## 2022-09-04 DIAGNOSIS — L400 Psoriasis vulgaris: Secondary | ICD-10-CM | POA: Diagnosis not present

## 2022-09-11 DIAGNOSIS — J449 Chronic obstructive pulmonary disease, unspecified: Secondary | ICD-10-CM | POA: Diagnosis not present

## 2022-09-17 DIAGNOSIS — J449 Chronic obstructive pulmonary disease, unspecified: Secondary | ICD-10-CM | POA: Diagnosis not present

## 2022-09-18 DIAGNOSIS — J449 Chronic obstructive pulmonary disease, unspecified: Secondary | ICD-10-CM | POA: Diagnosis not present

## 2022-09-19 DIAGNOSIS — J449 Chronic obstructive pulmonary disease, unspecified: Secondary | ICD-10-CM | POA: Diagnosis not present

## 2022-09-20 DIAGNOSIS — J449 Chronic obstructive pulmonary disease, unspecified: Secondary | ICD-10-CM | POA: Diagnosis not present

## 2022-09-24 DIAGNOSIS — J449 Chronic obstructive pulmonary disease, unspecified: Secondary | ICD-10-CM | POA: Diagnosis not present

## 2022-09-25 DIAGNOSIS — G4700 Insomnia, unspecified: Secondary | ICD-10-CM | POA: Diagnosis not present

## 2022-09-25 DIAGNOSIS — J984 Other disorders of lung: Secondary | ICD-10-CM | POA: Diagnosis not present

## 2022-09-25 DIAGNOSIS — F419 Anxiety disorder, unspecified: Secondary | ICD-10-CM | POA: Diagnosis not present

## 2022-09-25 DIAGNOSIS — Z5181 Encounter for therapeutic drug level monitoring: Secondary | ICD-10-CM | POA: Diagnosis not present

## 2022-09-25 DIAGNOSIS — R059 Cough, unspecified: Secondary | ICD-10-CM | POA: Diagnosis not present

## 2022-09-26 DIAGNOSIS — J449 Chronic obstructive pulmonary disease, unspecified: Secondary | ICD-10-CM | POA: Diagnosis not present

## 2022-09-29 DIAGNOSIS — N179 Acute kidney failure, unspecified: Secondary | ICD-10-CM | POA: Diagnosis not present

## 2022-09-29 DIAGNOSIS — J168 Pneumonia due to other specified infectious organisms: Secondary | ICD-10-CM | POA: Diagnosis not present

## 2022-09-29 DIAGNOSIS — M6281 Muscle weakness (generalized): Secondary | ICD-10-CM | POA: Diagnosis not present

## 2022-09-29 DIAGNOSIS — I110 Hypertensive heart disease with heart failure: Secondary | ICD-10-CM | POA: Diagnosis not present

## 2022-09-29 DIAGNOSIS — R0602 Shortness of breath: Secondary | ICD-10-CM | POA: Diagnosis not present

## 2022-09-29 DIAGNOSIS — F419 Anxiety disorder, unspecified: Secondary | ICD-10-CM | POA: Diagnosis not present

## 2022-09-29 DIAGNOSIS — N189 Chronic kidney disease, unspecified: Secondary | ICD-10-CM | POA: Diagnosis not present

## 2022-09-29 DIAGNOSIS — R2681 Unsteadiness on feet: Secondary | ICD-10-CM | POA: Diagnosis not present

## 2022-09-29 DIAGNOSIS — I5032 Chronic diastolic (congestive) heart failure: Secondary | ICD-10-CM | POA: Diagnosis not present

## 2022-09-29 DIAGNOSIS — I5033 Acute on chronic diastolic (congestive) heart failure: Secondary | ICD-10-CM | POA: Diagnosis not present

## 2022-09-29 DIAGNOSIS — Z66 Do not resuscitate: Secondary | ICD-10-CM | POA: Diagnosis not present

## 2022-09-29 DIAGNOSIS — B1920 Unspecified viral hepatitis C without hepatic coma: Secondary | ICD-10-CM | POA: Diagnosis not present

## 2022-09-29 DIAGNOSIS — I130 Hypertensive heart and chronic kidney disease with heart failure and stage 1 through stage 4 chronic kidney disease, or unspecified chronic kidney disease: Secondary | ICD-10-CM | POA: Diagnosis not present

## 2022-09-29 DIAGNOSIS — Z23 Encounter for immunization: Secondary | ICD-10-CM | POA: Diagnosis not present

## 2022-09-29 DIAGNOSIS — G4709 Other insomnia: Secondary | ICD-10-CM | POA: Diagnosis not present

## 2022-09-29 DIAGNOSIS — J189 Pneumonia, unspecified organism: Secondary | ICD-10-CM | POA: Diagnosis not present

## 2022-09-29 DIAGNOSIS — R918 Other nonspecific abnormal finding of lung field: Secondary | ICD-10-CM | POA: Diagnosis not present

## 2022-09-29 DIAGNOSIS — E875 Hyperkalemia: Secondary | ICD-10-CM | POA: Diagnosis not present

## 2022-09-29 DIAGNOSIS — J9811 Atelectasis: Secondary | ICD-10-CM | POA: Diagnosis not present

## 2022-09-29 DIAGNOSIS — R000 Tachycardia, unspecified: Secondary | ICD-10-CM | POA: Diagnosis not present

## 2022-09-29 DIAGNOSIS — J441 Chronic obstructive pulmonary disease with (acute) exacerbation: Secondary | ICD-10-CM | POA: Diagnosis not present

## 2022-09-29 DIAGNOSIS — I480 Paroxysmal atrial fibrillation: Secondary | ICD-10-CM | POA: Diagnosis not present

## 2022-09-29 DIAGNOSIS — J449 Chronic obstructive pulmonary disease, unspecified: Secondary | ICD-10-CM | POA: Diagnosis not present

## 2022-09-29 DIAGNOSIS — J432 Centrilobular emphysema: Secondary | ICD-10-CM | POA: Diagnosis not present

## 2022-09-29 DIAGNOSIS — I517 Cardiomegaly: Secondary | ICD-10-CM | POA: Diagnosis not present

## 2022-09-29 DIAGNOSIS — E782 Mixed hyperlipidemia: Secondary | ICD-10-CM | POA: Diagnosis not present

## 2022-09-29 DIAGNOSIS — R062 Wheezing: Secondary | ICD-10-CM | POA: Diagnosis not present

## 2022-10-02 DIAGNOSIS — Z23 Encounter for immunization: Secondary | ICD-10-CM | POA: Diagnosis not present

## 2022-10-02 DIAGNOSIS — I5032 Chronic diastolic (congestive) heart failure: Secondary | ICD-10-CM | POA: Diagnosis not present

## 2022-10-02 DIAGNOSIS — N179 Acute kidney failure, unspecified: Secondary | ICD-10-CM | POA: Diagnosis not present

## 2022-10-02 DIAGNOSIS — I480 Paroxysmal atrial fibrillation: Secondary | ICD-10-CM | POA: Diagnosis not present

## 2022-10-02 DIAGNOSIS — I110 Hypertensive heart disease with heart failure: Secondary | ICD-10-CM | POA: Diagnosis not present

## 2022-10-02 DIAGNOSIS — M6281 Muscle weakness (generalized): Secondary | ICD-10-CM | POA: Diagnosis not present

## 2022-10-02 DIAGNOSIS — J449 Chronic obstructive pulmonary disease, unspecified: Secondary | ICD-10-CM | POA: Diagnosis not present

## 2022-10-02 DIAGNOSIS — I501 Left ventricular failure: Secondary | ICD-10-CM | POA: Diagnosis not present

## 2022-10-02 DIAGNOSIS — I5033 Acute on chronic diastolic (congestive) heart failure: Secondary | ICD-10-CM | POA: Diagnosis not present

## 2022-10-02 DIAGNOSIS — R2681 Unsteadiness on feet: Secondary | ICD-10-CM | POA: Diagnosis not present

## 2022-10-02 DIAGNOSIS — G4709 Other insomnia: Secondary | ICD-10-CM | POA: Diagnosis not present

## 2022-10-02 DIAGNOSIS — R0602 Shortness of breath: Secondary | ICD-10-CM | POA: Diagnosis not present

## 2022-10-02 DIAGNOSIS — I10 Essential (primary) hypertension: Secondary | ICD-10-CM | POA: Diagnosis not present

## 2022-10-02 DIAGNOSIS — J441 Chronic obstructive pulmonary disease with (acute) exacerbation: Secondary | ICD-10-CM | POA: Diagnosis not present

## 2022-10-02 DIAGNOSIS — E782 Mixed hyperlipidemia: Secondary | ICD-10-CM | POA: Diagnosis not present

## 2022-10-03 DIAGNOSIS — I5032 Chronic diastolic (congestive) heart failure: Secondary | ICD-10-CM | POA: Diagnosis not present

## 2022-10-03 DIAGNOSIS — J449 Chronic obstructive pulmonary disease, unspecified: Secondary | ICD-10-CM | POA: Diagnosis not present

## 2022-10-03 DIAGNOSIS — I501 Left ventricular failure: Secondary | ICD-10-CM | POA: Diagnosis not present

## 2022-10-03 DIAGNOSIS — I10 Essential (primary) hypertension: Secondary | ICD-10-CM | POA: Diagnosis not present

## 2022-10-07 DIAGNOSIS — A000 Cholera due to Vibrio cholerae 01, biovar cholerae: Secondary | ICD-10-CM | POA: Diagnosis not present

## 2022-10-07 DIAGNOSIS — R2681 Unsteadiness on feet: Secondary | ICD-10-CM | POA: Diagnosis not present

## 2022-10-07 DIAGNOSIS — G4700 Insomnia, unspecified: Secondary | ICD-10-CM | POA: Diagnosis not present

## 2022-10-07 DIAGNOSIS — I480 Paroxysmal atrial fibrillation: Secondary | ICD-10-CM | POA: Diagnosis not present

## 2022-10-07 DIAGNOSIS — J449 Chronic obstructive pulmonary disease, unspecified: Secondary | ICD-10-CM | POA: Diagnosis not present

## 2022-10-07 DIAGNOSIS — M6281 Muscle weakness (generalized): Secondary | ICD-10-CM | POA: Diagnosis not present

## 2022-10-07 DIAGNOSIS — I110 Hypertensive heart disease with heart failure: Secondary | ICD-10-CM | POA: Diagnosis not present

## 2022-10-07 DIAGNOSIS — E559 Vitamin D deficiency, unspecified: Secondary | ICD-10-CM | POA: Diagnosis not present

## 2022-10-07 DIAGNOSIS — I509 Heart failure, unspecified: Secondary | ICD-10-CM | POA: Diagnosis not present

## 2022-10-07 DIAGNOSIS — I10 Essential (primary) hypertension: Secondary | ICD-10-CM | POA: Diagnosis not present

## 2022-10-07 DIAGNOSIS — E782 Mixed hyperlipidemia: Secondary | ICD-10-CM | POA: Diagnosis not present

## 2022-10-07 DIAGNOSIS — Z23 Encounter for immunization: Secondary | ICD-10-CM | POA: Diagnosis not present

## 2022-10-07 DIAGNOSIS — Z713 Dietary counseling and surveillance: Secondary | ICD-10-CM | POA: Diagnosis not present

## 2022-10-07 DIAGNOSIS — G4709 Other insomnia: Secondary | ICD-10-CM | POA: Diagnosis not present

## 2022-10-07 DIAGNOSIS — I5032 Chronic diastolic (congestive) heart failure: Secondary | ICD-10-CM | POA: Diagnosis not present

## 2022-10-07 DIAGNOSIS — R2243 Localized swelling, mass and lump, lower limb, bilateral: Secondary | ICD-10-CM | POA: Diagnosis not present

## 2022-10-07 DIAGNOSIS — J441 Chronic obstructive pulmonary disease with (acute) exacerbation: Secondary | ICD-10-CM | POA: Diagnosis not present

## 2022-10-07 DIAGNOSIS — D649 Anemia, unspecified: Secondary | ICD-10-CM | POA: Diagnosis not present

## 2022-10-16 DIAGNOSIS — I509 Heart failure, unspecified: Secondary | ICD-10-CM | POA: Diagnosis not present

## 2022-10-16 DIAGNOSIS — J449 Chronic obstructive pulmonary disease, unspecified: Secondary | ICD-10-CM | POA: Diagnosis not present

## 2022-10-16 DIAGNOSIS — G4700 Insomnia, unspecified: Secondary | ICD-10-CM | POA: Diagnosis not present

## 2022-10-16 DIAGNOSIS — Z713 Dietary counseling and surveillance: Secondary | ICD-10-CM | POA: Diagnosis not present

## 2022-10-21 DIAGNOSIS — J441 Chronic obstructive pulmonary disease with (acute) exacerbation: Secondary | ICD-10-CM | POA: Diagnosis not present

## 2022-10-21 DIAGNOSIS — R2243 Localized swelling, mass and lump, lower limb, bilateral: Secondary | ICD-10-CM | POA: Diagnosis not present

## 2022-10-23 DIAGNOSIS — I10 Essential (primary) hypertension: Secondary | ICD-10-CM | POA: Diagnosis not present

## 2022-10-23 DIAGNOSIS — A000 Cholera due to Vibrio cholerae 01, biovar cholerae: Secondary | ICD-10-CM | POA: Diagnosis not present

## 2022-10-23 DIAGNOSIS — E559 Vitamin D deficiency, unspecified: Secondary | ICD-10-CM | POA: Diagnosis not present

## 2022-10-24 DIAGNOSIS — D649 Anemia, unspecified: Secondary | ICD-10-CM | POA: Diagnosis not present

## 2022-10-24 DIAGNOSIS — E559 Vitamin D deficiency, unspecified: Secondary | ICD-10-CM | POA: Diagnosis not present

## 2022-10-27 DIAGNOSIS — T883XXA Malignant hyperthermia due to anesthesia, initial encounter: Secondary | ICD-10-CM | POA: Diagnosis not present

## 2022-10-27 DIAGNOSIS — J988 Other specified respiratory disorders: Secondary | ICD-10-CM | POA: Diagnosis not present

## 2022-10-27 DIAGNOSIS — J189 Pneumonia, unspecified organism: Secondary | ICD-10-CM | POA: Diagnosis not present

## 2022-10-27 DIAGNOSIS — R000 Tachycardia, unspecified: Secondary | ICD-10-CM | POA: Diagnosis not present

## 2022-10-27 DIAGNOSIS — M6281 Muscle weakness (generalized): Secondary | ICD-10-CM | POA: Diagnosis not present

## 2022-10-27 DIAGNOSIS — Z1612 Extended spectrum beta lactamase (ESBL) resistance: Secondary | ICD-10-CM | POA: Diagnosis not present

## 2022-10-27 DIAGNOSIS — J449 Chronic obstructive pulmonary disease, unspecified: Secondary | ICD-10-CM | POA: Diagnosis not present

## 2022-10-27 DIAGNOSIS — R059 Cough, unspecified: Secondary | ICD-10-CM | POA: Diagnosis not present

## 2022-10-27 DIAGNOSIS — N183 Chronic kidney disease, stage 3 unspecified: Secondary | ICD-10-CM | POA: Diagnosis not present

## 2022-10-27 DIAGNOSIS — A4151 Sepsis due to Escherichia coli [E. coli]: Secondary | ICD-10-CM | POA: Diagnosis not present

## 2022-10-27 DIAGNOSIS — J441 Chronic obstructive pulmonary disease with (acute) exacerbation: Secondary | ICD-10-CM | POA: Diagnosis not present

## 2022-10-27 DIAGNOSIS — R0600 Dyspnea, unspecified: Secondary | ICD-10-CM | POA: Diagnosis not present

## 2022-10-27 DIAGNOSIS — J9610 Chronic respiratory failure, unspecified whether with hypoxia or hypercapnia: Secondary | ICD-10-CM | POA: Diagnosis not present

## 2022-10-27 DIAGNOSIS — J9601 Acute respiratory failure with hypoxia: Secondary | ICD-10-CM | POA: Diagnosis not present

## 2022-10-27 DIAGNOSIS — Z743 Need for continuous supervision: Secondary | ICD-10-CM | POA: Diagnosis not present

## 2022-10-27 DIAGNOSIS — B9620 Unspecified Escherichia coli [E. coli] as the cause of diseases classified elsewhere: Secondary | ICD-10-CM | POA: Diagnosis not present

## 2022-10-27 DIAGNOSIS — R6520 Severe sepsis without septic shock: Secondary | ICD-10-CM | POA: Diagnosis not present

## 2022-10-27 DIAGNOSIS — R2681 Unsteadiness on feet: Secondary | ICD-10-CM | POA: Diagnosis not present

## 2022-10-27 DIAGNOSIS — J439 Emphysema, unspecified: Secondary | ICD-10-CM | POA: Diagnosis not present

## 2022-10-27 DIAGNOSIS — D509 Iron deficiency anemia, unspecified: Secondary | ICD-10-CM | POA: Diagnosis not present

## 2022-10-27 DIAGNOSIS — R069 Unspecified abnormalities of breathing: Secondary | ICD-10-CM | POA: Diagnosis not present

## 2022-10-27 DIAGNOSIS — N390 Urinary tract infection, site not specified: Secondary | ICD-10-CM | POA: Diagnosis not present

## 2022-10-27 DIAGNOSIS — I129 Hypertensive chronic kidney disease with stage 1 through stage 4 chronic kidney disease, or unspecified chronic kidney disease: Secondary | ICD-10-CM | POA: Diagnosis not present

## 2022-10-27 DIAGNOSIS — I480 Paroxysmal atrial fibrillation: Secondary | ICD-10-CM | POA: Diagnosis not present

## 2022-10-27 DIAGNOSIS — R6521 Severe sepsis with septic shock: Secondary | ICD-10-CM | POA: Diagnosis not present

## 2022-10-27 DIAGNOSIS — D72829 Elevated white blood cell count, unspecified: Secondary | ICD-10-CM | POA: Diagnosis not present

## 2022-10-27 DIAGNOSIS — R051 Acute cough: Secondary | ICD-10-CM | POA: Diagnosis not present

## 2022-10-27 DIAGNOSIS — Z23 Encounter for immunization: Secondary | ICD-10-CM | POA: Diagnosis not present

## 2022-10-27 DIAGNOSIS — R0602 Shortness of breath: Secondary | ICD-10-CM | POA: Diagnosis not present

## 2022-10-27 DIAGNOSIS — I5032 Chronic diastolic (congestive) heart failure: Secondary | ICD-10-CM | POA: Diagnosis not present

## 2022-10-27 DIAGNOSIS — I700 Atherosclerosis of aorta: Secondary | ICD-10-CM | POA: Diagnosis not present

## 2022-10-27 DIAGNOSIS — G4709 Other insomnia: Secondary | ICD-10-CM | POA: Diagnosis not present

## 2022-10-27 DIAGNOSIS — R7881 Bacteremia: Secondary | ICD-10-CM | POA: Diagnosis not present

## 2022-10-27 DIAGNOSIS — N10 Acute tubulo-interstitial nephritis: Secondary | ICD-10-CM | POA: Diagnosis not present

## 2022-10-27 DIAGNOSIS — J9611 Chronic respiratory failure with hypoxia: Secondary | ICD-10-CM | POA: Diagnosis not present

## 2022-10-27 DIAGNOSIS — A498 Other bacterial infections of unspecified site: Secondary | ICD-10-CM | POA: Diagnosis not present

## 2022-10-27 DIAGNOSIS — A4150 Gram-negative sepsis, unspecified: Secondary | ICD-10-CM | POA: Diagnosis not present

## 2022-10-27 DIAGNOSIS — I110 Hypertensive heart disease with heart failure: Secondary | ICD-10-CM | POA: Diagnosis not present

## 2022-10-27 DIAGNOSIS — E782 Mixed hyperlipidemia: Secondary | ICD-10-CM | POA: Diagnosis not present

## 2022-10-27 DIAGNOSIS — R0609 Other forms of dyspnea: Secondary | ICD-10-CM | POA: Diagnosis not present

## 2022-10-27 DIAGNOSIS — R509 Fever, unspecified: Secondary | ICD-10-CM | POA: Diagnosis not present

## 2022-10-27 DIAGNOSIS — R8281 Pyuria: Secondary | ICD-10-CM | POA: Diagnosis not present

## 2022-10-27 DIAGNOSIS — A419 Sepsis, unspecified organism: Secondary | ICD-10-CM | POA: Diagnosis not present

## 2022-10-27 DIAGNOSIS — I808 Phlebitis and thrombophlebitis of other sites: Secondary | ICD-10-CM | POA: Diagnosis not present

## 2022-10-28 DIAGNOSIS — J9601 Acute respiratory failure with hypoxia: Secondary | ICD-10-CM | POA: Diagnosis not present

## 2022-10-28 DIAGNOSIS — A4151 Sepsis due to Escherichia coli [E. coli]: Secondary | ICD-10-CM | POA: Diagnosis not present

## 2022-10-28 DIAGNOSIS — T883XXA Malignant hyperthermia due to anesthesia, initial encounter: Secondary | ICD-10-CM | POA: Diagnosis not present

## 2022-10-28 DIAGNOSIS — A498 Other bacterial infections of unspecified site: Secondary | ICD-10-CM | POA: Diagnosis not present

## 2022-10-28 DIAGNOSIS — A419 Sepsis, unspecified organism: Secondary | ICD-10-CM | POA: Diagnosis not present

## 2022-10-28 DIAGNOSIS — D72829 Elevated white blood cell count, unspecified: Secondary | ICD-10-CM | POA: Diagnosis not present

## 2022-10-28 DIAGNOSIS — R6521 Severe sepsis with septic shock: Secondary | ICD-10-CM | POA: Diagnosis not present

## 2022-10-28 DIAGNOSIS — R000 Tachycardia, unspecified: Secondary | ICD-10-CM | POA: Diagnosis not present

## 2022-10-28 DIAGNOSIS — Z1612 Extended spectrum beta lactamase (ESBL) resistance: Secondary | ICD-10-CM | POA: Diagnosis not present

## 2022-10-28 DIAGNOSIS — N10 Acute tubulo-interstitial nephritis: Secondary | ICD-10-CM | POA: Diagnosis not present

## 2022-10-28 DIAGNOSIS — R0602 Shortness of breath: Secondary | ICD-10-CM | POA: Diagnosis not present

## 2022-10-28 DIAGNOSIS — N390 Urinary tract infection, site not specified: Secondary | ICD-10-CM | POA: Diagnosis not present

## 2022-10-28 DIAGNOSIS — A4150 Gram-negative sepsis, unspecified: Secondary | ICD-10-CM | POA: Diagnosis not present

## 2022-10-29 DIAGNOSIS — R0600 Dyspnea, unspecified: Secondary | ICD-10-CM | POA: Diagnosis not present

## 2022-10-29 DIAGNOSIS — A4150 Gram-negative sepsis, unspecified: Secondary | ICD-10-CM | POA: Diagnosis not present

## 2022-10-29 DIAGNOSIS — R8281 Pyuria: Secondary | ICD-10-CM | POA: Diagnosis not present

## 2022-10-29 DIAGNOSIS — R7881 Bacteremia: Secondary | ICD-10-CM | POA: Diagnosis not present

## 2022-10-29 DIAGNOSIS — A498 Other bacterial infections of unspecified site: Secondary | ICD-10-CM | POA: Diagnosis not present

## 2022-10-29 DIAGNOSIS — N10 Acute tubulo-interstitial nephritis: Secondary | ICD-10-CM | POA: Diagnosis not present

## 2022-10-29 DIAGNOSIS — A419 Sepsis, unspecified organism: Secondary | ICD-10-CM | POA: Diagnosis not present

## 2022-10-29 DIAGNOSIS — D72829 Elevated white blood cell count, unspecified: Secondary | ICD-10-CM | POA: Diagnosis not present

## 2022-10-29 DIAGNOSIS — J439 Emphysema, unspecified: Secondary | ICD-10-CM | POA: Diagnosis not present

## 2022-10-29 DIAGNOSIS — Z1612 Extended spectrum beta lactamase (ESBL) resistance: Secondary | ICD-10-CM | POA: Diagnosis not present

## 2022-10-29 DIAGNOSIS — R0609 Other forms of dyspnea: Secondary | ICD-10-CM | POA: Diagnosis not present

## 2022-10-29 DIAGNOSIS — B9620 Unspecified Escherichia coli [E. coli] as the cause of diseases classified elsewhere: Secondary | ICD-10-CM | POA: Diagnosis not present

## 2022-10-29 DIAGNOSIS — T883XXA Malignant hyperthermia due to anesthesia, initial encounter: Secondary | ICD-10-CM | POA: Diagnosis not present

## 2022-10-29 DIAGNOSIS — I700 Atherosclerosis of aorta: Secondary | ICD-10-CM | POA: Diagnosis not present

## 2022-10-29 DIAGNOSIS — N390 Urinary tract infection, site not specified: Secondary | ICD-10-CM | POA: Diagnosis not present

## 2022-10-29 DIAGNOSIS — R0602 Shortness of breath: Secondary | ICD-10-CM | POA: Diagnosis not present

## 2022-10-30 DIAGNOSIS — R8281 Pyuria: Secondary | ICD-10-CM | POA: Diagnosis not present

## 2022-10-30 DIAGNOSIS — N10 Acute tubulo-interstitial nephritis: Secondary | ICD-10-CM | POA: Diagnosis not present

## 2022-10-30 DIAGNOSIS — B9620 Unspecified Escherichia coli [E. coli] as the cause of diseases classified elsewhere: Secondary | ICD-10-CM | POA: Diagnosis not present

## 2022-10-30 DIAGNOSIS — D72829 Elevated white blood cell count, unspecified: Secondary | ICD-10-CM | POA: Diagnosis not present

## 2022-10-30 DIAGNOSIS — J439 Emphysema, unspecified: Secondary | ICD-10-CM | POA: Diagnosis not present

## 2022-10-30 DIAGNOSIS — A498 Other bacterial infections of unspecified site: Secondary | ICD-10-CM | POA: Diagnosis not present

## 2022-10-30 DIAGNOSIS — A4150 Gram-negative sepsis, unspecified: Secondary | ICD-10-CM | POA: Diagnosis not present

## 2022-10-30 DIAGNOSIS — T883XXA Malignant hyperthermia due to anesthesia, initial encounter: Secondary | ICD-10-CM | POA: Diagnosis not present

## 2022-10-30 DIAGNOSIS — Z1612 Extended spectrum beta lactamase (ESBL) resistance: Secondary | ICD-10-CM | POA: Diagnosis not present

## 2022-10-30 DIAGNOSIS — R7881 Bacteremia: Secondary | ICD-10-CM | POA: Diagnosis not present

## 2022-10-31 DIAGNOSIS — J988 Other specified respiratory disorders: Secondary | ICD-10-CM | POA: Diagnosis not present

## 2022-10-31 DIAGNOSIS — J929 Pleural plaque without asbestos: Secondary | ICD-10-CM | POA: Diagnosis not present

## 2022-10-31 DIAGNOSIS — J449 Chronic obstructive pulmonary disease, unspecified: Secondary | ICD-10-CM | POA: Diagnosis not present

## 2022-10-31 DIAGNOSIS — R000 Tachycardia, unspecified: Secondary | ICD-10-CM | POA: Diagnosis not present

## 2022-10-31 DIAGNOSIS — R188 Other ascites: Secondary | ICD-10-CM | POA: Diagnosis not present

## 2022-10-31 DIAGNOSIS — Z743 Need for continuous supervision: Secondary | ICD-10-CM | POA: Diagnosis not present

## 2022-10-31 DIAGNOSIS — R609 Edema, unspecified: Secondary | ICD-10-CM | POA: Diagnosis not present

## 2022-10-31 DIAGNOSIS — R0602 Shortness of breath: Secondary | ICD-10-CM | POA: Diagnosis not present

## 2022-10-31 DIAGNOSIS — N183 Chronic kidney disease, stage 3 unspecified: Secondary | ICD-10-CM | POA: Diagnosis not present

## 2022-10-31 DIAGNOSIS — R059 Cough, unspecified: Secondary | ICD-10-CM | POA: Diagnosis not present

## 2022-10-31 DIAGNOSIS — R6520 Severe sepsis without septic shock: Secondary | ICD-10-CM | POA: Diagnosis not present

## 2022-10-31 DIAGNOSIS — I808 Phlebitis and thrombophlebitis of other sites: Secondary | ICD-10-CM | POA: Diagnosis not present

## 2022-10-31 DIAGNOSIS — G4709 Other insomnia: Secondary | ICD-10-CM | POA: Diagnosis not present

## 2022-10-31 DIAGNOSIS — R799 Abnormal finding of blood chemistry, unspecified: Secondary | ICD-10-CM | POA: Diagnosis not present

## 2022-10-31 DIAGNOSIS — J90 Pleural effusion, not elsewhere classified: Secondary | ICD-10-CM | POA: Diagnosis not present

## 2022-10-31 DIAGNOSIS — Z20822 Contact with and (suspected) exposure to covid-19: Secondary | ICD-10-CM | POA: Diagnosis not present

## 2022-10-31 DIAGNOSIS — E782 Mixed hyperlipidemia: Secondary | ICD-10-CM | POA: Diagnosis not present

## 2022-10-31 DIAGNOSIS — E8809 Other disorders of plasma-protein metabolism, not elsewhere classified: Secondary | ICD-10-CM | POA: Diagnosis not present

## 2022-10-31 DIAGNOSIS — I5032 Chronic diastolic (congestive) heart failure: Secondary | ICD-10-CM | POA: Diagnosis not present

## 2022-10-31 DIAGNOSIS — Z452 Encounter for adjustment and management of vascular access device: Secondary | ICD-10-CM | POA: Diagnosis not present

## 2022-10-31 DIAGNOSIS — J9610 Chronic respiratory failure, unspecified whether with hypoxia or hypercapnia: Secondary | ICD-10-CM | POA: Diagnosis not present

## 2022-10-31 DIAGNOSIS — I480 Paroxysmal atrial fibrillation: Secondary | ICD-10-CM | POA: Diagnosis not present

## 2022-10-31 DIAGNOSIS — I129 Hypertensive chronic kidney disease with stage 1 through stage 4 chronic kidney disease, or unspecified chronic kidney disease: Secondary | ICD-10-CM | POA: Diagnosis not present

## 2022-10-31 DIAGNOSIS — J439 Emphysema, unspecified: Secondary | ICD-10-CM | POA: Diagnosis not present

## 2022-10-31 DIAGNOSIS — M6281 Muscle weakness (generalized): Secondary | ICD-10-CM | POA: Diagnosis not present

## 2022-10-31 DIAGNOSIS — R509 Fever, unspecified: Secondary | ICD-10-CM | POA: Diagnosis not present

## 2022-10-31 DIAGNOSIS — K5730 Diverticulosis of large intestine without perforation or abscess without bleeding: Secondary | ICD-10-CM | POA: Diagnosis not present

## 2022-10-31 DIAGNOSIS — I110 Hypertensive heart disease with heart failure: Secondary | ICD-10-CM | POA: Diagnosis not present

## 2022-10-31 DIAGNOSIS — N390 Urinary tract infection, site not specified: Secondary | ICD-10-CM | POA: Diagnosis not present

## 2022-10-31 DIAGNOSIS — R2681 Unsteadiness on feet: Secondary | ICD-10-CM | POA: Diagnosis not present

## 2022-10-31 DIAGNOSIS — Z23 Encounter for immunization: Secondary | ICD-10-CM | POA: Diagnosis not present

## 2022-10-31 DIAGNOSIS — A4151 Sepsis due to Escherichia coli [E. coli]: Secondary | ICD-10-CM | POA: Diagnosis not present

## 2022-10-31 DIAGNOSIS — R062 Wheezing: Secondary | ICD-10-CM | POA: Diagnosis not present

## 2022-10-31 DIAGNOSIS — R6521 Severe sepsis with septic shock: Secondary | ICD-10-CM | POA: Diagnosis not present

## 2022-10-31 DIAGNOSIS — R079 Chest pain, unspecified: Secondary | ICD-10-CM | POA: Diagnosis not present

## 2022-10-31 DIAGNOSIS — R7881 Bacteremia: Secondary | ICD-10-CM | POA: Diagnosis not present

## 2022-11-01 DIAGNOSIS — G4709 Other insomnia: Secondary | ICD-10-CM | POA: Diagnosis not present

## 2022-11-01 DIAGNOSIS — R0602 Shortness of breath: Secondary | ICD-10-CM | POA: Diagnosis not present

## 2022-11-01 DIAGNOSIS — R609 Edema, unspecified: Secondary | ICD-10-CM | POA: Diagnosis not present

## 2022-11-01 DIAGNOSIS — A4151 Sepsis due to Escherichia coli [E. coli]: Secondary | ICD-10-CM | POA: Diagnosis not present

## 2022-11-01 DIAGNOSIS — I480 Paroxysmal atrial fibrillation: Secondary | ICD-10-CM | POA: Diagnosis not present

## 2022-11-01 DIAGNOSIS — R000 Tachycardia, unspecified: Secondary | ICD-10-CM | POA: Diagnosis not present

## 2022-11-01 DIAGNOSIS — Z23 Encounter for immunization: Secondary | ICD-10-CM | POA: Diagnosis not present

## 2022-11-01 DIAGNOSIS — B9620 Unspecified Escherichia coli [E. coli] as the cause of diseases classified elsewhere: Secondary | ICD-10-CM | POA: Diagnosis not present

## 2022-11-01 DIAGNOSIS — M6281 Muscle weakness (generalized): Secondary | ICD-10-CM | POA: Diagnosis not present

## 2022-11-01 DIAGNOSIS — I5032 Chronic diastolic (congestive) heart failure: Secondary | ICD-10-CM | POA: Diagnosis not present

## 2022-11-01 DIAGNOSIS — E8770 Fluid overload, unspecified: Secondary | ICD-10-CM | POA: Diagnosis not present

## 2022-11-01 DIAGNOSIS — I129 Hypertensive chronic kidney disease with stage 1 through stage 4 chronic kidney disease, or unspecified chronic kidney disease: Secondary | ICD-10-CM | POA: Diagnosis not present

## 2022-11-01 DIAGNOSIS — Z743 Need for continuous supervision: Secondary | ICD-10-CM | POA: Diagnosis not present

## 2022-11-01 DIAGNOSIS — R079 Chest pain, unspecified: Secondary | ICD-10-CM | POA: Diagnosis not present

## 2022-11-01 DIAGNOSIS — R6521 Severe sepsis with septic shock: Secondary | ICD-10-CM | POA: Diagnosis not present

## 2022-11-01 DIAGNOSIS — R062 Wheezing: Secondary | ICD-10-CM | POA: Diagnosis not present

## 2022-11-01 DIAGNOSIS — J9610 Chronic respiratory failure, unspecified whether with hypoxia or hypercapnia: Secondary | ICD-10-CM | POA: Diagnosis not present

## 2022-11-01 DIAGNOSIS — M545 Low back pain, unspecified: Secondary | ICD-10-CM | POA: Diagnosis not present

## 2022-11-01 DIAGNOSIS — J439 Emphysema, unspecified: Secondary | ICD-10-CM | POA: Diagnosis not present

## 2022-11-01 DIAGNOSIS — R29898 Other symptoms and signs involving the musculoskeletal system: Secondary | ICD-10-CM | POA: Diagnosis not present

## 2022-11-01 DIAGNOSIS — J449 Chronic obstructive pulmonary disease, unspecified: Secondary | ICD-10-CM | POA: Diagnosis not present

## 2022-11-01 DIAGNOSIS — Z20822 Contact with and (suspected) exposure to covid-19: Secondary | ICD-10-CM | POA: Diagnosis not present

## 2022-11-01 DIAGNOSIS — E8809 Other disorders of plasma-protein metabolism, not elsewhere classified: Secondary | ICD-10-CM | POA: Diagnosis not present

## 2022-11-01 DIAGNOSIS — N183 Chronic kidney disease, stage 3 unspecified: Secondary | ICD-10-CM | POA: Diagnosis not present

## 2022-11-01 DIAGNOSIS — J929 Pleural plaque without asbestos: Secondary | ICD-10-CM | POA: Diagnosis not present

## 2022-11-01 DIAGNOSIS — R799 Abnormal finding of blood chemistry, unspecified: Secondary | ICD-10-CM | POA: Diagnosis not present

## 2022-11-01 DIAGNOSIS — Z452 Encounter for adjustment and management of vascular access device: Secondary | ICD-10-CM | POA: Diagnosis not present

## 2022-11-01 DIAGNOSIS — R7881 Bacteremia: Secondary | ICD-10-CM | POA: Diagnosis not present

## 2022-11-01 DIAGNOSIS — J90 Pleural effusion, not elsewhere classified: Secondary | ICD-10-CM | POA: Diagnosis not present

## 2022-11-01 DIAGNOSIS — R509 Fever, unspecified: Secondary | ICD-10-CM | POA: Diagnosis not present

## 2022-11-01 DIAGNOSIS — E782 Mixed hyperlipidemia: Secondary | ICD-10-CM | POA: Diagnosis not present

## 2022-11-01 DIAGNOSIS — R2681 Unsteadiness on feet: Secondary | ICD-10-CM | POA: Diagnosis not present

## 2022-11-01 DIAGNOSIS — R188 Other ascites: Secondary | ICD-10-CM | POA: Diagnosis not present

## 2022-11-01 DIAGNOSIS — K5730 Diverticulosis of large intestine without perforation or abscess without bleeding: Secondary | ICD-10-CM | POA: Diagnosis not present

## 2022-11-01 DIAGNOSIS — I110 Hypertensive heart disease with heart failure: Secondary | ICD-10-CM | POA: Diagnosis not present

## 2022-11-01 DIAGNOSIS — N1831 Chronic kidney disease, stage 3a: Secondary | ICD-10-CM | POA: Diagnosis not present

## 2022-11-01 DIAGNOSIS — R059 Cough, unspecified: Secondary | ICD-10-CM | POA: Diagnosis not present

## 2022-11-04 DIAGNOSIS — R0602 Shortness of breath: Secondary | ICD-10-CM | POA: Diagnosis not present

## 2022-11-04 DIAGNOSIS — R29898 Other symptoms and signs involving the musculoskeletal system: Secondary | ICD-10-CM | POA: Diagnosis not present

## 2022-11-04 DIAGNOSIS — I10 Essential (primary) hypertension: Secondary | ICD-10-CM | POA: Diagnosis not present

## 2022-11-04 DIAGNOSIS — I509 Heart failure, unspecified: Secondary | ICD-10-CM | POA: Diagnosis not present

## 2022-11-04 DIAGNOSIS — M6281 Muscle weakness (generalized): Secondary | ICD-10-CM | POA: Diagnosis not present

## 2022-11-04 DIAGNOSIS — E782 Mixed hyperlipidemia: Secondary | ICD-10-CM | POA: Diagnosis not present

## 2022-11-04 DIAGNOSIS — Z743 Need for continuous supervision: Secondary | ICD-10-CM | POA: Diagnosis not present

## 2022-11-04 DIAGNOSIS — J9610 Chronic respiratory failure, unspecified whether with hypoxia or hypercapnia: Secondary | ICD-10-CM | POA: Diagnosis not present

## 2022-11-04 DIAGNOSIS — J439 Emphysema, unspecified: Secondary | ICD-10-CM | POA: Diagnosis not present

## 2022-11-04 DIAGNOSIS — Z23 Encounter for immunization: Secondary | ICD-10-CM | POA: Diagnosis not present

## 2022-11-04 DIAGNOSIS — E8809 Other disorders of plasma-protein metabolism, not elsewhere classified: Secondary | ICD-10-CM | POA: Diagnosis not present

## 2022-11-04 DIAGNOSIS — R6521 Severe sepsis with septic shock: Secondary | ICD-10-CM | POA: Diagnosis not present

## 2022-11-04 DIAGNOSIS — N183 Chronic kidney disease, stage 3 unspecified: Secondary | ICD-10-CM | POA: Diagnosis not present

## 2022-11-04 DIAGNOSIS — R2681 Unsteadiness on feet: Secondary | ICD-10-CM | POA: Diagnosis not present

## 2022-11-04 DIAGNOSIS — I480 Paroxysmal atrial fibrillation: Secondary | ICD-10-CM | POA: Diagnosis not present

## 2022-11-04 DIAGNOSIS — J449 Chronic obstructive pulmonary disease, unspecified: Secondary | ICD-10-CM | POA: Diagnosis not present

## 2022-11-04 DIAGNOSIS — I5032 Chronic diastolic (congestive) heart failure: Secondary | ICD-10-CM | POA: Diagnosis not present

## 2022-11-04 DIAGNOSIS — I110 Hypertensive heart disease with heart failure: Secondary | ICD-10-CM | POA: Diagnosis not present

## 2022-11-04 DIAGNOSIS — R188 Other ascites: Secondary | ICD-10-CM | POA: Diagnosis not present

## 2022-11-04 DIAGNOSIS — A4151 Sepsis due to Escherichia coli [E. coli]: Secondary | ICD-10-CM | POA: Diagnosis not present

## 2022-11-04 DIAGNOSIS — G4709 Other insomnia: Secondary | ICD-10-CM | POA: Diagnosis not present

## 2022-11-04 DIAGNOSIS — M545 Low back pain, unspecified: Secondary | ICD-10-CM | POA: Diagnosis not present

## 2022-11-05 DIAGNOSIS — M545 Low back pain, unspecified: Secondary | ICD-10-CM | POA: Diagnosis not present

## 2022-11-06 DIAGNOSIS — R0602 Shortness of breath: Secondary | ICD-10-CM | POA: Diagnosis not present

## 2022-11-06 DIAGNOSIS — J449 Chronic obstructive pulmonary disease, unspecified: Secondary | ICD-10-CM | POA: Diagnosis not present

## 2022-11-06 DIAGNOSIS — I509 Heart failure, unspecified: Secondary | ICD-10-CM | POA: Diagnosis not present

## 2022-11-06 DIAGNOSIS — I10 Essential (primary) hypertension: Secondary | ICD-10-CM | POA: Diagnosis not present

## 2022-11-12 DIAGNOSIS — H5213 Myopia, bilateral: Secondary | ICD-10-CM | POA: Diagnosis not present

## 2022-12-12 DIAGNOSIS — J449 Chronic obstructive pulmonary disease, unspecified: Secondary | ICD-10-CM | POA: Diagnosis not present

## 2022-12-12 DIAGNOSIS — I5032 Chronic diastolic (congestive) heart failure: Secondary | ICD-10-CM | POA: Diagnosis not present

## 2022-12-12 DIAGNOSIS — I110 Hypertensive heart disease with heart failure: Secondary | ICD-10-CM | POA: Diagnosis not present

## 2022-12-12 DIAGNOSIS — M1389 Other specified arthritis, multiple sites: Secondary | ICD-10-CM | POA: Diagnosis not present

## 2022-12-17 DIAGNOSIS — G4709 Other insomnia: Secondary | ICD-10-CM | POA: Diagnosis not present

## 2022-12-17 DIAGNOSIS — F411 Generalized anxiety disorder: Secondary | ICD-10-CM | POA: Diagnosis not present

## 2023-01-14 DIAGNOSIS — M6281 Muscle weakness (generalized): Secondary | ICD-10-CM | POA: Diagnosis not present

## 2023-01-14 DIAGNOSIS — F5104 Psychophysiologic insomnia: Secondary | ICD-10-CM | POA: Diagnosis not present

## 2023-01-14 DIAGNOSIS — F411 Generalized anxiety disorder: Secondary | ICD-10-CM | POA: Diagnosis not present

## 2023-01-14 DIAGNOSIS — J42 Unspecified chronic bronchitis: Secondary | ICD-10-CM | POA: Diagnosis not present

## 2023-01-14 DIAGNOSIS — I5032 Chronic diastolic (congestive) heart failure: Secondary | ICD-10-CM | POA: Diagnosis not present

## 2023-01-14 DIAGNOSIS — E785 Hyperlipidemia, unspecified: Secondary | ICD-10-CM | POA: Diagnosis not present

## 2023-01-14 DIAGNOSIS — I480 Paroxysmal atrial fibrillation: Secondary | ICD-10-CM | POA: Diagnosis not present

## 2023-01-14 DIAGNOSIS — I110 Hypertensive heart disease with heart failure: Secondary | ICD-10-CM | POA: Diagnosis not present

## 2023-01-14 DIAGNOSIS — Z789 Other specified health status: Secondary | ICD-10-CM | POA: Diagnosis not present

## 2023-04-09 DEATH — deceased
